# Patient Record
Sex: Male | Born: 1950 | Hispanic: No | Smoking: Never smoker
Health system: Southern US, Community
[De-identification: ages and names within clinical notes are randomized; demographics above are authoritative.]

## PROBLEM LIST (undated history)

## (undated) DIAGNOSIS — I10 Essential (primary) hypertension: Secondary | ICD-10-CM

## (undated) DIAGNOSIS — I491 Atrial premature depolarization: Secondary | ICD-10-CM

## (undated) DIAGNOSIS — R079 Chest pain, unspecified: Secondary | ICD-10-CM

## (undated) DIAGNOSIS — L408 Other psoriasis: Secondary | ICD-10-CM

## (undated) DIAGNOSIS — I493 Ventricular premature depolarization: Secondary | ICD-10-CM

## (undated) DIAGNOSIS — I77810 Thoracic aortic ectasia: Secondary | ICD-10-CM

## (undated) HISTORY — DX: Chest pain, unspecified: R07.9

## (undated) HISTORY — PX: APPENDECTOMY: SHX54

## (undated) HISTORY — DX: Essential (primary) hypertension: I10

## (undated) HISTORY — DX: Atrial premature depolarization: I49.1

## (undated) HISTORY — DX: Ventricular premature depolarization: I49.3

## (undated) HISTORY — DX: Thoracic aortic ectasia: I77.810

## (undated) HISTORY — DX: Other psoriasis: L40.8

---

## 2004-03-25 ENCOUNTER — Ambulatory Visit: Payer: Self-pay | Admitting: Cardiology

## 2004-04-01 ENCOUNTER — Ambulatory Visit: Payer: Self-pay

## 2004-05-27 ENCOUNTER — Ambulatory Visit: Payer: Self-pay | Admitting: Cardiovascular Disease

## 2005-06-13 ENCOUNTER — Ambulatory Visit: Payer: Self-pay | Admitting: Cardiovascular Disease

## 2006-06-29 ENCOUNTER — Ambulatory Visit: Payer: Self-pay | Admitting: Cardiovascular Disease

## 2007-06-21 ENCOUNTER — Ambulatory Visit: Payer: Self-pay | Admitting: Cardiovascular Disease

## 2008-02-18 ENCOUNTER — Ambulatory Visit: Payer: Self-pay | Admitting: Cardiovascular Disease

## 2008-02-18 LAB — CONVERTED CEMR LAB
Basophils Absolute: 0 10*3/uL (ref 0.0–0.1)
Basophils Relative: 0.2 % (ref 0.0–3.0)
Eosinophils Absolute: 0.4 10*3/uL (ref 0.0–0.7)
Hemoglobin: 15.3 g/dL (ref 13.0–17.0)
MCHC: 34.5 g/dL (ref 30.0–36.0)
MCV: 91.5 fL (ref 78.0–100.0)
Neutro Abs: 3.1 10*3/uL (ref 1.4–7.7)
RBC: 4.86 M/uL (ref 4.22–5.81)

## 2008-02-25 DIAGNOSIS — I10 Essential (primary) hypertension: Secondary | ICD-10-CM | POA: Insufficient documentation

## 2008-02-25 DIAGNOSIS — R079 Chest pain, unspecified: Secondary | ICD-10-CM

## 2008-02-25 DIAGNOSIS — L408 Other psoriasis: Secondary | ICD-10-CM

## 2009-02-05 ENCOUNTER — Ambulatory Visit: Payer: Self-pay | Admitting: Cardiovascular Disease

## 2010-01-21 ENCOUNTER — Ambulatory Visit: Payer: Self-pay | Admitting: Cardiovascular Disease

## 2010-01-21 DIAGNOSIS — I491 Atrial premature depolarization: Secondary | ICD-10-CM

## 2010-01-25 LAB — CONVERTED CEMR LAB
Basophils Relative: 0.5 % (ref 0.0–3.0)
Eosinophils Relative: 3.9 % (ref 0.0–5.0)
Hemoglobin: 15.4 g/dL (ref 13.0–17.0)
Lymphocytes Relative: 33.8 % (ref 12.0–46.0)
Monocytes Relative: 8.8 % (ref 3.0–12.0)
Neutro Abs: 4.3 10*3/uL (ref 1.4–7.7)
Neutrophils Relative %: 53 % (ref 43.0–77.0)
RBC: 4.98 M/uL (ref 4.22–5.81)
WBC: 8.1 10*3/uL (ref 4.5–10.5)

## 2010-02-03 ENCOUNTER — Encounter (INDEPENDENT_AMBULATORY_CARE_PROVIDER_SITE_OTHER): Payer: Self-pay | Admitting: *Deleted

## 2010-02-04 ENCOUNTER — Telehealth: Payer: Self-pay | Admitting: Cardiovascular Disease

## 2010-03-20 LAB — CONVERTED CEMR LAB
ANA Titer 1: 1:160 {titer} — ABNORMAL HIGH
Alkaline Phosphatase: 57 units/L (ref 39–117)
Basophils Absolute: 0.1 10*3/uL (ref 0.0–0.1)
Basophils Relative: 0.7 % (ref 0.0–3.0)
Bilirubin, Direct: 0.1 mg/dL (ref 0.0–0.3)
CO2: 26 meq/L (ref 19–32)
Calcium: 9.4 mg/dL (ref 8.4–10.5)
Creatinine, Ser: 0.9 mg/dL (ref 0.4–1.5)
Eosinophils Absolute: 0.4 10*3/uL (ref 0.0–0.7)
Lymphocytes Relative: 31.1 % (ref 12.0–46.0)
MCHC: 32.9 g/dL (ref 30.0–36.0)
Neutrophils Relative %: 53.8 % (ref 43.0–77.0)
Platelets: 262 10*3/uL (ref 150.0–400.0)
RBC: 4.68 M/uL (ref 4.22–5.81)
Total Bilirubin: 0.9 mg/dL (ref 0.3–1.2)
Total Protein: 7 g/dL (ref 6.0–8.3)
WBC: 7.5 10*3/uL (ref 4.5–10.5)

## 2010-03-22 NOTE — Assessment & Plan Note (Signed)
Summary: F1Y/DM      Allergies Added: NKDA  History of Present Illness: Chad Beasley is seen today for F/U of chest pain and HTN.  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  He has had a normal myuovue in 2006 and 2008.  He has HTN which is well Rx with Altace.  He has not had frequent primary care F/U.  He sees Chief Financial Officer in [place].  He is on [place] for psoriasis and needs blood work for this especially a CBC with PLT.  He still works at [employer] and is not having any fatique, dyspnea, fever, palpatations or edema  He is having some PAC's on exam today which are asymptomatic  Current Problems (verified): 1)  Long-term (CURRENT) Use of Other Medications  (ICD-V58.69) 2)  Psoriasis  (ICD-696.1) 3)  Chest Pain-unspecified  (ICD-786.50) 4)  Hypertension  (ICD-401.9)  Current Medications (verified): 1)  Ramipril 5 Mg Caps (Ramipril) .... Take One Capsule By Mouth Daily 2)  Humira 20 Mg/0.20ml Kit (Adalimumab) .... Two Times Monthly 3)  Vitamin C 500 Mg Tabs (Ascorbic Acid) .Marland Kitchen.. 1 Tab By Mouth Once Daily 4)  Fish Oil   Oil (Fish Oil) .Marland Kitchen.. 1 Tab By Mouth Once Daily  Allergies (verified): No Known Drug Allergies  Past History:  Past Medical History: Last updated: 02/25/2008 CHEST PAIN-UNSPECIFIED (ICD-786.50) HYPERTENSION (ICD-401.9) Psoriasis  Family History: Last updated: 02/25/2008 Negative for premature CAD  Social History: Last updated: 02/05/2009 Full Time [employer] in [employer] Married  Tobacco Use - No.  Regular Exercise - no Alcohol Use - yes(social)  Review of Systems       Denies fever, malais, weight loss, blurry vision, decreased visual acuity, cough, sputum, SOB, hemoptysis, pleuritic pain, palpitaitons, heartburn, abdominal pain, melena, lower extremity edema, claudication, or rash.   Vital Signs:  Patient profile:   60 year old male Pulse rate:   62 / minute Resp:     11 per minute BP supine:   120 / 80  Physical  Exam  General:  Affect appropriate Healthy:  appears stated age HEENT: normal Neck supple with no adenopathy JVP normal no bruits no thyromegaly Lungs clear with no wheezing and good diaphragmatic motion Heart:  S1/S2 no murmur,rub, gallop or click PMI normal Abdomen: benighn, BS positve, no tenderness, no AAA no bruit.  No HSM or HJR Distal pulses intact with no bruits No edema Neuro non-focal Skin warm and dry Psoriatic lesion on arms   Impression & Recommendations:  Problem # 1:  PSORIASIS (ICD-696.1) Continue Humera  CBC and PLT count today.    Problem # 2:  CHEST PAIN-UNSPECIFIED (ICD-786.50)  Non recurrent normal myovue in past Observe His updated medication list for this problem includes:    Ramipril 5 Mg Caps (Ramipril) .Marland Kitchen... Take one capsule by mouth daily  His updated medication list for this problem includes:    Ramipril 5 Mg Caps (Ramipril) .Marland Kitchen... Take one capsule by mouth daily  Problem # 3:  HYPERTENSION (ICD-401.9) Well controlled  [facility] His updated medication list for this problem includes:    Ramipril 5 Mg Caps (Ramipril) .Marland Kitchen... Take one capsule by mouth daily  His updated medication list for this problem includes:    Ramipril 5 Mg Caps (Ramipril) .Marland Kitchen... Take one capsule by mouth daily  Problem # 4:  PAC (ICD-427.61) Asymptomatic with no known structural heart disease Observe His updated medication list for this problem includes:    Ramipril 5  Mg Caps (Ramipril) .Marland Kitchen... Take one capsule by mouth daily  Other Orders: TLB-CBC Platelet - w/Differential (85025-CBCD)  Patient Instructions: 1)  Your physician recommends that you schedule a follow-up appointment in: YEAR WITH DR Eden Emms 2)  Your physician recommends that you return for lab work in: TODAY CBC WITH PLATELET V58.69 3)  Your physician recommends that you continue on your current medications as directed. Please refer to the Current Medication list given to you  today. Prescriptions: RAMIPRIL 5 MG CAPS (RAMIPRIL) Take one capsule by mouth daily  #90 x 3   Entered by:   Scherrie Bateman, LPN   Authorized by:   Colon Branch, MD, [facility]   Signed by:   Scherrie Bateman, LPN on 10/93/2355   Method used:   Faxed to ...       [facility] Environmental education officer)       [address] [address]       [place], [place]  [address]       Ph: [phone]       Fax: [phone]   RxID:   [id number]

## 2010-03-24 NOTE — Letter (Signed)
Summary: Generic Letter  Architectural technologist, Main Office  [address]   [place], [place] [address]   Phone: [phone]  Fax: [phone]        February 03, 2010 MRN: [id number]    Chad Beasley [address] [name], [place]  [address]    Dear [name],       I have not been able to reach you by phone to let you know your blood work results. We checked your blood count and everything was normal. Please call with questions or concerns.    Sincerely,  Deliah Goody, RN/Dr Charlton Haws

## 2010-03-24 NOTE — Progress Notes (Signed)
Summary: lab work   Phone Note Call from Patient Call back at [employer] [phone]   Caller: Patient Reason for Call: Talk to Nurse, Lab or Test Results Summary of Call: re blood work. pt states its fine to leave blood work results on vm. Initial call taken by: Roe Coombs,  February 04, 2010 8:36 AM  Follow-up for Phone Call        Pt. aware of lab results. Follow-up by: Ollen Gross, RN, BSN,  February 04, 2010 9:01 AM

## 2010-07-05 NOTE — Assessment & Plan Note (Signed)
[facility]                            CARDIOLOGY OFFICE NOTE   KENSON, GROH                           MRN:          [id number]  DATE:06/29/2006                            DOB:          08-14-1950    Chad Beasley is seen today in followup. He has had atypical chest pain in the  past with a normal Myoview. He has hypertension which is well controlled  on Altace, he has not had significant problems. He has not had any  recurrent chest pain or palpitations. He has been taking his Altace. He  needed a prescription. He prefers to stay on regular Altace and not  switch to generic Ramipril.   His weight has been stable, he is not drinking or smoking. He has been  watching the salt in his diet.   REVIEW OF SYSTEMS:  Remarkable for continued arthritis and psoriasis  which he takes Enbrel for and is actually under fairly good control  right now. Review of systems otherwise negative.   MEDICATIONS:  His only medications include Enbrel weekly and Altace 5 mg  a day.   PHYSICAL EXAMINATION:  GENERAL:  Remarkable for a healthy-appearing,  middle-aged male in no distress. Mood is appropriate.  VITAL SIGNS:  Blood pressure is 120/78, pulse 75 and regular, weight is  189 which is up 2 pounds from last April.  HEENT:  Normal.  NECK:  Carotids are normal without bruit. There is no lymphadenopathy.  No JVP elevation, no carotid bruits.  LUNGS:  Clear with no wheezing and normal diaphragmatic motion.  HEART:  There is an S1, S2 with normal heart sounds. PMI is normal.  ABDOMEN:  Benign. Bowel sounds are positive. There is no tenderness. No  hepatosplenomegaly. No hepatojugular reflux, no AAA and no renal bruits.  EXTREMITIES:  Distal pulses are intact with no edema. Femorals were +2  without bruit. PTs are palpable bilaterally. There is no  lymphadenopathy, no varicosities.  NEUROLOGIC:  Nonfocal. There is no muscular weakness. His EKG was  totally normal today.   IMPRESSION:  Stable previous atypical chest pain. Need for followup  treadmill test next April which will be 3 years from his last Myoview.  Continue blood pressure therapy. He will take a baby aspirin a day for  stroke prophylaxis. He was given a refill for his Altace today. I will  have to look up Enbrel to make sure there are no long-term side effects  referable to the heart. It was encouraging to see his EKG totally normal  today without evidence of LVH.     Noralyn Pick. Eden Emms, MD, [facility]  Electronically Signed   PCN/MedQ  DD: 06/29/2006  DT: 06/30/2006  Job #: [id number]

## 2010-07-05 NOTE — Procedures (Signed)
[facility]                              EXERCISE TREADMILL   LUCIUS, WISE                           MRN:          [id number]  DATE:06/21/2007                            DOB:          [birth year]    INDICATIONS:  Previous chest pain.   The patient exercised to the equivalent of 1 minute into stage IV of the  Bruce protocol. First 3 stages were advanced after 2 minutes.  Maximal  heart rate was 166, peak blood pressure is 173/89.  The patient had a  brief episode of bigeminy during the first stage.  There is no other  significant ectopy.  There was no chest pain.  There was no ischemia.   IMPRESSION:  Negative stress test with a good workload     Peter C. Eden Emms, MD, [facility]  Electronically Signed    PCN/MedQ  DD: 06/21/2007  DT: 06/21/2007  Job #: [id number]

## 2010-07-05 NOTE — Assessment & Plan Note (Signed)
[facility]                            CARDIOLOGY OFFICE NOTE   Chad Beasley, Chad Beasley                           MRN:          [id number]  DATE:02/18/2008                            DOB:          10/26/1950    Chad Beasley returns today for followup.  I have seen him for chest pain in  the past.  He had a treadmill test in May which was normal.   He has significant hypertension, we treated with Altace.  He has been  trying to watch salt in his diet.  The Altace has done a good job with  his blood pressure.   He has not had any significant chest pain, palpitations, PND, or  orthopnea.   REVIEW OF SYSTEMS:  Remarkable for continued psoriasis.  He takes Enbrel  for this.  He has not had a CBC and platelet count in over a year.   His psoriasis has flared somewhat lately.   Otherwise negative.   MEDICATIONS:  1. Enbrel 50 mcg injection weekly.  2. Altace 5 mg a day.   He uses [facility].   PHYSICAL EXAMINATION:  GENERAL:  Remarkable for a healthy-appearing  white male in no distress.  VITAL SIGNS:  Weight is 186, blood pressure 124/82, pulse 70 and  regular, respiratory rate 14, afebrile.  HEENT:  Unremarkable.  NECK:  Carotids are normal without bruit.  No lymphadenopathy,  thyromegaly, or JVP elevation.  LUNGS:  Clear.  Good diaphragmatic motion.  No wheezing.  CARDIAC:  S1, S2 with normal heart sounds.  PMI normal.  Abdomen is  benign.  Bowel sounds positive.  No AAA.  No tenderness.  No bruit.  No  hepatosplenomegaly or hepatojugular reflux.  EXTREMITIES:  Distal pulses are intact.  No edema.  NEUROLOGIC:  Nonfocal.  SKIN:  Warm and dry with some psoriatic plaques over the elbows and  knees.  MUSCULOSKELETAL:  No muscular weakness.   IMPRESSION:  1. Previous chest pain, non recurrent, normal exercise treadmill test      in May.  Continue to monitor, baby aspirin a day.  2. Hypertension, currently well controlled.  Continue low-sodium diet     and Altace.  3. Psoriasis.  CBC and platelet count to be done today.  I warned him      about the risks of      chronic immunosuppression on Enbrel.  Followup with Dr. Mayford Knife      regarding further therapy for his psoriasis.     Noralyn Pick. Eden Emms, MD, [facility]  Electronically Signed    PCN/MedQ  DD: 02/18/2008  DT: 02/18/2008  Job #: [id number]

## 2011-05-05 ENCOUNTER — Ambulatory Visit (INDEPENDENT_AMBULATORY_CARE_PROVIDER_SITE_OTHER): Payer: Managed Care, Other (non HMO) | Admitting: Family Medicine

## 2011-05-05 VITALS — BP 138/80 | HR 77 | Temp 98.2°F | Resp 16 | Ht 72.0 in | Wt 191.6 lb

## 2011-05-05 DIAGNOSIS — J019 Acute sinusitis, unspecified: Secondary | ICD-10-CM

## 2011-05-05 MED ORDER — AMOXICILLIN 875 MG PO TABS: 875.0000 mg | ORAL_TABLET | Freq: Two times a day (BID) | ORAL | Status: AC

## 2011-05-05 NOTE — Progress Notes (Signed)
61 year old gentleman with sinus congestion for 3 weeks. He complains of discolored mucus and epistaxis along with this voice had no fever. He's tried saline nasal lavage which has helped a little at times. 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  Objective: Mucopurulent nasal discharge bilateral nasal passages  TMs-negative, oropharynx: Negative  Neck: Supple no adenopathy  Chest clear to auscultation  Heart: Regular no murmur  Skin warm and dry  Assessment: Sinusitis greater than 10 days  Plan amoxicillin

## 2012-01-29 ENCOUNTER — Encounter: Payer: Self-pay | Admitting: *Deleted

## 2012-01-29 ENCOUNTER — Ambulatory Visit (INDEPENDENT_AMBULATORY_CARE_PROVIDER_SITE_OTHER): Payer: Managed Care, Other (non HMO) | Admitting: Cardiovascular Disease

## 2012-01-29 ENCOUNTER — Encounter: Payer: Self-pay | Admitting: Cardiovascular Disease

## 2012-01-29 VITALS — BP 144/74 | HR 88 | Ht 71.0 in | Wt 190.0 lb

## 2012-01-29 DIAGNOSIS — Z79899 Other long term (current) drug therapy: Secondary | ICD-10-CM

## 2012-01-29 DIAGNOSIS — I498 Other specified cardiac arrhythmias: Secondary | ICD-10-CM

## 2012-01-29 DIAGNOSIS — R008 Other abnormalities of heart beat: Secondary | ICD-10-CM

## 2012-01-29 DIAGNOSIS — I10 Essential (primary) hypertension: Secondary | ICD-10-CM

## 2012-01-29 DIAGNOSIS — L408 Other psoriasis: Secondary | ICD-10-CM

## 2012-01-29 LAB — CBC WITH DIFFERENTIAL/PLATELET
Basophils Relative: 0.4 % (ref 0.0–3.0)
Eosinophils Relative: 3.1 % (ref 0.0–5.0)
Lymphocytes Relative: 25.2 % (ref 12.0–46.0)
MCV: 91.5 fl (ref 78.0–100.0)
Monocytes Absolute: 0.9 10*3/uL (ref 0.1–1.0)
Monocytes Relative: 8.4 % (ref 3.0–12.0)
Neutrophils Relative %: 62.9 % (ref 43.0–77.0)
RBC: 4.82 Mil/uL (ref 4.22–5.81)
WBC: 10.8 10*3/uL — ABNORMAL HIGH (ref 4.5–10.5)

## 2012-01-29 MED ORDER — LOSARTAN POTASSIUM 100 MG PO TABS: 100.0000 mg | ORAL_TABLET | Freq: Every day | ORAL | Status: DC

## 2012-01-29 NOTE — Assessment & Plan Note (Signed)
D/C ramapril  Change to Cozaar Low sodium diet  See what BP does with exercise

## 2012-01-29 NOTE — Progress Notes (Signed)
Patient ID: Chad Beasley, male   DOB: 11/17/1950, 61 y.o.   MRN: [id number] Chad Beasley is seen today for F/U of chest pain and HTN. Has not been seen in 3 years.   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 He has had a normal myuovue in 2006 and 2008. He has HTN which does not appear to be adequatley Rx  He has not had frequent primary care F/U. He sees Chief Financial Officer in [place]. He is on [place] for psoriasis and needs blood work for this especially a CBC with PLT. He still works at [employer] and is not having any fatique, dyspnea, fever, palpatations or edema  Was in bigeminny today which is new for him Unaware of arrhythmia   ROS: Denies fever, malais, weight loss, blurry vision, decreased visual acuity, cough, sputum, SOB, hemoptysis, pleuritic pain, palpitaitons, heartburn, abdominal pain, melena, lower extremity edema, claudication, or rash.  All other systems reviewed and negative  General: Affect appropriate Healthy:  appears stated age HEENT: normal Neck supple with no adenopathy JVP normal no bruits no thyromegaly Lungs clear with no wheezing and good diaphragmatic motion Heart:  S1/S2 no murmur, no rub, gallop or click PMI normal Abdomen: benighn, BS positve, no tenderness, no AAA no bruit.  No HSM or HJR Distal pulses intact with no bruits No edema Neuro non-focal Skin warm and dry No muscular weakness   Current Outpatient Prescriptions  Medication Sig Dispense Refill  . Adalimumab (HUMIRA) 20 MG/0.4ML KIT Inject 20 mg into the skin See admin instructions. 2 times monthly      . Omeprazole (PRILOSEC PO) Take 1 tablet by mouth daily.      . ramipril (ALTACE) 5 MG capsule Take 5 mg by mouth daily.      . Red Yeast Rice Extract (RED YEAST RICE PO) Take 2 tablets by mouth daily.      . ST JOHNS WORT PO Take 2 tablets by mouth daily.      . vitamin C (ASCORBIC ACID) 500 MG tablet Take 500 mg by mouth daily.        Allergies  Review of patient's allergies indicates no  known allergies.  Electrocardiogram:  SR rate 88 bigeminy with unifocal PVC;s  Assessment and Plan

## 2012-01-29 NOTE — Assessment & Plan Note (Signed)
Needs F/U stress echo to make sure no NSVT with exercise, assess BP response andn EF given frequent PVC;s

## 2012-01-29 NOTE — Assessment & Plan Note (Signed)
Continue Humera  F/U CBC and PLT

## 2012-01-29 NOTE — Patient Instructions (Addendum)
Your physician wants you to follow-up in:   6 MONTHS  WITH DR Haywood Filler will receive a reminder letter in the mail two months in advance. If you don't receive a letter, please call our office to schedule the follow-up appointment. Your physician has recommended you make the following change in your medication: STOP RAMIPRIL START  LOSARTAN 100 MG  EVERY DAY Your physician has requested that you have a stress echocardiogram. For further information please visit [email or web address] Please follow instruction sheet as given. DX PVC Your physician recommends that you return for lab work in: TODAY CBC

## 2012-02-12 ENCOUNTER — Ambulatory Visit (HOSPITAL_BASED_OUTPATIENT_CLINIC_OR_DEPARTMENT_OTHER): Payer: Managed Care, Other (non HMO)

## 2012-02-12 ENCOUNTER — Ambulatory Visit (HOSPITAL_COMMUNITY): Payer: Managed Care, Other (non HMO) | Attending: Cardiology | Admitting: Radiology

## 2012-02-12 ENCOUNTER — Encounter: Payer: Self-pay | Admitting: Cardiovascular Disease

## 2012-02-12 DIAGNOSIS — I491 Atrial premature depolarization: Secondary | ICD-10-CM | POA: Insufficient documentation

## 2012-02-12 DIAGNOSIS — R072 Precordial pain: Secondary | ICD-10-CM | POA: Insufficient documentation

## 2012-02-12 DIAGNOSIS — R079 Chest pain, unspecified: Secondary | ICD-10-CM | POA: Insufficient documentation

## 2012-02-12 DIAGNOSIS — I10 Essential (primary) hypertension: Secondary | ICD-10-CM | POA: Insufficient documentation

## 2012-02-12 DIAGNOSIS — R008 Other abnormalities of heart beat: Secondary | ICD-10-CM

## 2012-02-12 DIAGNOSIS — R0989 Other specified symptoms and signs involving the circulatory and respiratory systems: Secondary | ICD-10-CM

## 2012-02-12 NOTE — Progress Notes (Signed)
Echocardiogram performed.  

## 2012-08-13 ENCOUNTER — Ambulatory Visit (INDEPENDENT_AMBULATORY_CARE_PROVIDER_SITE_OTHER): Payer: Managed Care, Other (non HMO) | Admitting: Cardiovascular Disease

## 2012-08-13 ENCOUNTER — Encounter: Payer: Self-pay | Admitting: Cardiovascular Disease

## 2012-08-13 VITALS — BP 154/80 | HR 82 | Wt 187.0 lb

## 2012-08-13 DIAGNOSIS — I10 Essential (primary) hypertension: Secondary | ICD-10-CM

## 2012-08-13 MED ORDER — LOSARTAN POTASSIUM 100 MG PO TABS: 100.0000 mg | ORAL_TABLET | Freq: Every day | ORAL | Status: DC

## 2012-08-13 NOTE — Patient Instructions (Addendum)
Your physician wants you to follow-up in: YEAR WITH DR NISHAN  You will receive a reminder letter in the mail two months in advance. If you don't receive a letter, please call our office to schedule the follow-up appointment.  Your physician recommends that you continue on your current medications as directed. Please refer to the Current Medication list given to you today. 

## 2012-08-13 NOTE — Assessment & Plan Note (Signed)
Normal EF No CAD no worsening with exercise  Observe Only notices at night when he lays down

## 2012-08-13 NOTE — Assessment & Plan Note (Signed)
Well controlled.  Continue current medications and low sodium Dash type diet.    

## 2012-08-13 NOTE — Assessment & Plan Note (Signed)
Small patch on right knee Continue humira  F/U Dr Mayford Knife

## 2012-08-13 NOTE — Progress Notes (Signed)
Patient ID: Chad Beasley, male   DOB: [birth year], 62 y.o.   MRN: [id number] Chad Beasley is seen today for F/U of chest pain and HTN. 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 He has had a normal myuovue in 2006 and 2008. He has HTN which is well Rx with Altace. He has not had frequent primary care F/U.   He is on [place] for psoriasis and CBC ok 12/13  . He still works at [employer] and is not having any fatique, dyspnea, fever, palpatations or edema He is having some PAC's on exam today which are asymptomatic  Dr Mayford Knife folloows CBC with humira Dermatology  [facility] family is primary  Stress echo done 6/13   Baseline:  - LV size was normal. - The estimated LV ejection fraction was 55%. - Normal wall motion; no LV regional wall motion abnormalities. Peak stress:  - LV size was normal. - The estimated LV ejection fraction was 65%. - Normal wall motion; no LV regional wall motion abnormalities.  ROS: Denies fever, malais, weight loss, blurry vision, decreased visual acuity, cough, sputum, SOB, hemoptysis, pleuritic pain, palpitaitons, heartburn, abdominal pain, melena, lower extremity edema, claudication, or rash.  All other systems reviewed and negative  General: Affect appropriate Healthy:  appears stated age HEENT: normal Neck supple with no adenopathy JVP normal no bruits no thyromegaly Lungs clear with no wheezing and good diaphragmatic motion Heart:  S1/S2 no murmur, no rub, gallop or click PMI normal Abdomen: benighn, BS positve, no tenderness, no AAA no bruit.  No HSM or HJR Distal pulses intact with no bruits No edema Neuro non-focal Skin warm and dry No muscular weakness   Current Outpatient Prescriptions  Medication Sig Dispense Refill  . Adalimumab (HUMIRA) 20 MG/0.4ML KIT Inject 20 mg into the skin See admin instructions. 2 times monthly      . losartan (COZAAR) 100 MG tablet Take 1 tablet (100 mg total) by mouth daily.  90 tablet  3  . Omeprazole  (PRILOSEC PO) Take 1 tablet by mouth daily.      . Red Yeast Rice Extract (RED YEAST RICE PO) Take 2 tablets by mouth daily.      . ST JOHNS WORT PO Take 2 tablets by mouth daily.      . vitamin C (ASCORBIC ACID) 500 MG tablet Take 500 mg by mouth daily.       No current facility-administered medications for this visit.    Allergies  Review of patient's allergies indicates no known allergies.  Electrocardiogram:  SR PVC;s no acute ischemic changes 12/13   Assessment and Plan

## 2012-08-13 NOTE — Assessment & Plan Note (Signed)
Non recurrent normal stress echo 9/13

## 2012-10-07 ENCOUNTER — Ambulatory Visit (INDEPENDENT_AMBULATORY_CARE_PROVIDER_SITE_OTHER): Payer: Managed Care, Other (non HMO) | Admitting: Emergency Medicine

## 2012-10-07 VITALS — BP 122/78 | HR 70 | Temp 98.0°F | Resp 18 | Ht 72.0 in | Wt 186.0 lb

## 2012-10-07 DIAGNOSIS — L408 Other psoriasis: Secondary | ICD-10-CM

## 2012-10-07 DIAGNOSIS — L409 Psoriasis, unspecified: Secondary | ICD-10-CM

## 2012-10-07 DIAGNOSIS — B3742 Candidal balanitis: Secondary | ICD-10-CM

## 2012-10-07 DIAGNOSIS — B3749 Other urogenital candidiasis: Secondary | ICD-10-CM

## 2012-10-07 MED ORDER — TRIAMCINOLONE ACETONIDE 0.1 % EX CREA: TOPICAL_CREAM | Freq: Two times a day (BID) | CUTANEOUS | Status: DC

## 2012-10-07 MED ORDER — FLUCONAZOLE 100 MG PO TABS: 100.0000 mg | ORAL_TABLET | Freq: Every day | ORAL | Status: DC

## 2012-10-07 NOTE — Progress Notes (Signed)
[facility] [address] [place] [address] [phone]  Date:  10/07/2012   Name:  Chad Beasley   DOB:  [birth year]   MRN:  [id number]  PCP:  Desmond Dike, MD    Chief Complaint: Rash   History of Present Illness:  Chad Beasley is a 62 y.o. very pleasant male patient who presents with the following:  History of psoriasis and on humira.  Has an erythematous flat eruption on his glans and foreskin.  Circumcised.  Rash is pruritic and burns.  No discharge or dysuria.  Not sexually active as his partner is recovering from chemo for non hodgkins lymphoma.  No improvement with over the counter medications or other home remedies. Denies other complaint or health concern today.   Patient Active Problem List   Diagnosis Date Noted  . 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 [facility] 01/21/2010  . HYPERTENSION 02/25/2008  . PSORIASIS 02/25/2008  . CHEST PAIN-UNSPECIFIED 02/25/2008    Past Medical History  Diagnosis Date  . HYPERTENSION   . PAC   . PSORIASIS   . CHEST PAIN-UNSPECIFIED     History reviewed. No pertinent past surgical history.  History  Substance Use Topics  . Smoking status: Never Smoker   . Smokeless tobacco: Not on file  . Alcohol Use: Not on file    Family History  Problem Relation Age of Onset  . Anemia Father     No Known Allergies  Medication list has been reviewed and updated.  Current Outpatient Prescriptions on File Prior to Visit  Medication Sig Dispense Refill  . Adalimumab (HUMIRA) 20 MG/0.4ML KIT Inject 40 mg into the skin See admin instructions. 2 times monthly      . losartan (COZAAR) 100 MG tablet Take 1 tablet (100 mg total) by mouth daily.  90 tablet  3  . Omeprazole (PRILOSEC PO) Take 1 tablet by mouth daily.      . Red Yeast Rice Extract (RED YEAST RICE PO) Take 2 tablets by mouth daily.      . ST JOHNS WORT PO Take 2 tablets by mouth daily.      . vitamin C (ASCORBIC ACID) 500 MG tablet Take 1,000 mg by mouth daily.         No current facility-administered medications on file prior to visit.    Review of Systems:  As per HPI, otherwise negative.    Physical Examination: Filed Vitals:   10/07/12 0811  BP: 122/78  Pulse: 70  Temp: 98 F (36.7 C)  Resp: 18   Filed Vitals:   10/07/12 0811  Height: 6' (1.829 m)  Weight: 186 lb (84.369 kg)   Body mass index is 25.22 kg/(m^2). Ideal Body Weight: Weight in (lb) to have BMI = 25: 183.9   GEN: WDWN, NAD, Non-toxic, Alert & Oriented x 3 HEENT: Atraumatic, Normocephalic.  Ears and Nose: No external deformity. EXTR: No clubbing/cyanosis/edema NEURO: Normal gait.  PSYCH: Normally interactive. Conversant. Not depressed or anxious appearing.  Calm demeanor.  GENITALIA:  Circumcised male with erythematous flat patches, not scaly.  No excoriation or vesicles.    Assessment and Plan: Psoriasis vs candida Diflucan TAC  Signed,  Phillips Odor, MD

## 2012-10-07 NOTE — Patient Instructions (Addendum)
Candida Infection, Adult A candida infection (also called yeast, fungus and Monilia infection) is an overgrowth of yeast that can occur anywhere on the body. A yeast infection commonly occurs in warm, moist body areas. Usually, the infection remains localized but can spread to become a systemic infection. A yeast infection may be a sign of a more severe disease such as diabetes, leukemia, or AIDS. A yeast infection can occur in both men and women. In women, Candida vaginitis is a vaginal infection. It is one of the most common causes of vaginitis. Men usually do not have symptoms or know they have an infection until other problems develop. Men may find out they have a yeast infection because their sex partner has a yeast infection. Uncircumcised men are more likely to get a yeast infection than circumcised men. This is because the uncircumcised glans is not exposed to air and does not remain as dry as that of a circumcised glans. Older adults may develop yeast infections around dentures. CAUSES  Women  Antibiotics.  Steroid medication taken for a long time.  Being overweight (obese).  Diabetes.  Poor immune condition.  Certain serious medical conditions.  Immune suppressive medications for organ transplant patients.  Chemotherapy.  Pregnancy.  Menstration.  Stress and fatigue.  Intravenous drug use.  Oral contraceptives.  Wearing tight-fitting clothes in the crotch area.  Catching it from a sex partner who has a yeast infection.  Spermicide.  Intravenous, urinary, or other catheters. Men  Catching it from a sex partner who has a yeast infection.  Having oral or anal sex with a person who has the infection.  Spermicide.  Diabetes.  Antibiotics.  Poor immune system.  Medications that suppress the immune system.  Intravenous drug use.  Intravenous, urinary, or other catheters. SYMPTOMS  Women  Thick, white vaginal discharge.  Vaginal itching.  Redness and  swelling in and around the vagina.  Irritation of the lips of the vagina and perineum.  Blisters on the vaginal lips and perineum.  Painful sexual intercourse.  Low blood sugar (hypoglycemia).  Painful urination.  Bladder infections.  Intestinal problems such as constipation, indigestion, bad breath, bloating, increase in gas, diarrhea, or loose stools. Men  Men may develop intestinal problems such as constipation, indigestion, bad breath, bloating, increase in gas, diarrhea, or loose stools.  Dry, cracked skin on the penis with itching or discomfort.  Jock itch.  Dry, flaky skin.  Athlete's foot.  Hypoglycemia. DIAGNOSIS  Women  A history and an exam are performed.  The discharge may be examined under a microscope.  A culture may be taken of the discharge. Men  A history and an exam are performed.  Any discharge from the penis or areas of cracked skin will be looked at under the microscope and cultured.  Stool samples may be cultured. TREATMENT  Women  Vaginal antifungal suppositories and creams.  Medicated creams to decrease irritation and itching on the outside of the vagina.  Warm compresses to the perineal area to decrease swelling and discomfort.  Oral antifungal medications.  Medicated vaginal suppositories or cream for repeated or recurrent infections.  Wash and dry the irritation areas before applying the cream.  Eating yogurt with lactobacillus may help with prevention and treatment.  Sometimes painting the vagina with gentian violet solution may help if creams and suppositories do not work. Men  Antifungal creams and oral antifungal medications.  Sometimes treatment must continue for 30 days after the symptoms go away to prevent recurrence. HOME CARE   INSTRUCTIONS  Women  Use cotton underwear and avoid tight-fitting clothing.  Avoid colored, scented toilet paper and deodorant tampons or pads.  Do not douche.  Keep your diabetes  under control.  Finish all the prescribed medications.  Keep your skin clean and dry.  Consume milk or yogurt with lactobacillus active culture regularly. If you get frequent yeast infections and think that is what the infection is, there are over-the-counter medications that you can get. If the infection does not show healing in 3 days, talk to your caregiver.  Tell your sex partner you have a yeast infection. Your partner may need treatment also, especially if your infection does not clear up or recurs. Men  Keep your skin clean and dry.  Keep your diabetes under control.  Finish all prescribed medications.  Tell your sex partner that you have a yeast infection so they can be treated if necessary. SEEK MEDICAL CARE IF:   Your symptoms do not clear up or worsen in one week after treatment.  You have an oral temperature above 102 F (38.9 C).  You have trouble swallowing or eating for a prolonged time.  You develop blisters on and around your vagina.  You develop vaginal bleeding and it is not your menstrual period.  You develop abdominal pain.  You develop intestinal problems as mentioned above.  You get weak or lightheaded.  You have painful or increased urination.  You have pain during sexual intercourse. MAKE SURE YOU:   Understand these instructions.  Will watch your condition.  Will get help right away if you are not doing well or get worse. Document Released: 03/16/2004 Document Revised: 05/01/2011 Document Reviewed: 06/28/2009 Exit[employer] Patient Information 2014 [employer].  

## 2013-06-26 ENCOUNTER — Encounter: Payer: Self-pay | Admitting: Cardiovascular Disease

## 2013-08-08 ENCOUNTER — Ambulatory Visit: Payer: Managed Care, Other (non HMO) | Admitting: Cardiovascular Disease

## 2013-08-21 ENCOUNTER — Encounter: Payer: Self-pay | Admitting: *Deleted

## 2013-08-26 ENCOUNTER — Ambulatory Visit: Payer: Managed Care, Other (non HMO) | Admitting: Cardiovascular Disease

## 2013-08-28 ENCOUNTER — Encounter: Payer: Self-pay | Admitting: Cardiovascular Disease

## 2013-08-28 ENCOUNTER — Ambulatory Visit (INDEPENDENT_AMBULATORY_CARE_PROVIDER_SITE_OTHER): Payer: Managed Care, Other (non HMO) | Admitting: Cardiovascular Disease

## 2013-08-28 VITALS — BP 140/64 | HR 88 | Ht 70.5 in | Wt 186.0 lb

## 2013-08-28 DIAGNOSIS — I10 Essential (primary) hypertension: Secondary | ICD-10-CM

## 2013-08-28 DIAGNOSIS — I493 Ventricular premature depolarization: Secondary | ICD-10-CM

## 2013-08-28 DIAGNOSIS — I4949 Other premature depolarization: Secondary | ICD-10-CM

## 2013-08-28 MED ORDER — LOSARTAN POTASSIUM 100 MG PO TABS: 100.0000 mg | ORAL_TABLET | Freq: Every day | ORAL | Status: DC

## 2013-08-28 NOTE — Patient Instructions (Signed)
Your physician recommends that you schedule a follow-up appointment in: [place]   Your physician recommends that you continue on your current medications as directed. Please refer to the Current Medication list given to you today. Your physician has requested that you have an echocardiogram. Echocardiography is a painless test that uses sound waves to create images of your heart. It provides your doctor with information about the size and shape of your heart and how well your heart's chambers and valves are working. This procedure takes approximately one hour. There are no restrictions for this procedure.  Your physician has recommended that you wear a holter monitor. Holter monitors are medical devices that record the heart's electrical activity. Doctors most often use these monitors to diagnose arrhythmias. Arrhythmias are problems with the speed or rhythm of the heartbeat. The monitor is a small, portable device. You can wear one while you do your normal daily activities. This is usually used to diagnose what is causing palpitations/syncope (passing out).  24 HOUR

## 2013-08-28 NOTE — Assessment & Plan Note (Signed)
Echo given ETOH intake and 24 hr monitor  Likely would benefit from beta blocker especially with borderline BP

## 2013-08-28 NOTE — Progress Notes (Signed)
Patient ID: Chad Beasley, male   DOB: 1950/12/02, 63 y.o.   MRN: [id number] Sur[place] is seen today for F/U of chest pain and HTN. Has not been seen in 3 years. 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 He has had a normal myuovue in 2006 and 2008. He has HTN which does not appear to be adequatley Rx He has not had frequent primary care F/U. He sees Education officer, environmental in [place]. He is on [place] for psoriasis and needs blood work for this especially a CBC with PLT. He still works at [employer] and is not having any fatique, dyspnea, fever, palpatations or edema  Still having beers daily  Needs refill on BP med  Compliant  PAC;s and PVC;s on exam    ROS: Denies fever, malais, weight loss, blurry vision, decreased visual acuity, cough, sputum, SOB, hemoptysis, pleuritic pain, palpitaitons, heartburn, abdominal pain, melena, lower extremity edema, claudication, or rash.  All other systems reviewed and negative  General: Affect appropriate Healthy:  appears stated age 52: normal Neck supple with no adenopathy JVP normal no bruits no thyromegaly Lungs clear with no wheezing and good diaphragmatic motion Heart:  S1/S2 no murmur, no rub, gallop or click PMI normal Abdomen: benighn, BS positve, no tenderness, no AAA no bruit.  No HSM or HJR Distal pulses intact with no bruits No edema Neuro non-focal Skin warm and dry No muscular weakness   Current Outpatient Prescriptions  Medication Sig Dispense Refill  . Adalimumab (HUMIRA) 20 MG/0.4ML KIT Inject 40 mg into the skin See admin instructions. 2 times monthly      . clobetasol cream (TEMOVATE) 3.75 % Apply 1 application topically 2 (two) times daily.      Marland Kitchen losartan (COZAAR) 100 MG tablet Take 1 tablet (100 mg total) by mouth daily.  90 tablet  3  . Omeprazole (PRILOSEC PO) Take 1 tablet by mouth daily.      . Red Yeast Rice Extract (RED YEAST RICE PO) Take 2 tablets by mouth daily.      . vitamin C (ASCORBIC ACID) 500 MG tablet Take  1,000 mg by mouth daily.       . ST JOHNS WORT PO Take 2 tablets by mouth daily.       No current facility-administered medications for this visit.    Allergies  Review of patient's allergies indicates no known allergies.  Electrocardiogram:   SR rate 88  PAC PVC    Assessment and Plan

## 2013-08-28 NOTE — Assessment & Plan Note (Signed)
Borderline control continue current meds Home readings and recheck next available

## 2013-09-24 ENCOUNTER — Ambulatory Visit (HOSPITAL_COMMUNITY): Payer: Managed Care, Other (non HMO) | Attending: Cardiovascular Disease | Admitting: Radiology

## 2013-09-24 ENCOUNTER — Encounter: Payer: Self-pay | Admitting: Radiology

## 2013-09-24 ENCOUNTER — Encounter (INDEPENDENT_AMBULATORY_CARE_PROVIDER_SITE_OTHER): Payer: Managed Care, Other (non HMO)

## 2013-09-24 DIAGNOSIS — I493 Ventricular premature depolarization: Secondary | ICD-10-CM

## 2013-09-24 DIAGNOSIS — I491 Atrial premature depolarization: Secondary | ICD-10-CM

## 2013-09-24 DIAGNOSIS — I4949 Other premature depolarization: Secondary | ICD-10-CM

## 2013-09-24 NOTE — Progress Notes (Signed)
Patient ID: Chad Beasley, male   DOB: 1951/02/16, 63 y.o.   MRN: [id number] E cardio 24 hr holter monitor applied

## 2013-09-24 NOTE — Progress Notes (Signed)
Echocardiogram performed.  

## 2013-10-09 ENCOUNTER — Telehealth: Payer: Self-pay | Admitting: Cardiovascular Disease

## 2013-10-09 NOTE — Telephone Encounter (Signed)
New Message  Pt called to receive ECHO and monitor results. Please call

## 2013-10-10 NOTE — Telephone Encounter (Signed)
LMTCB ./CY 

## 2013-10-13 NOTE — Telephone Encounter (Signed)
PT  AWARE OF MONITOR  AND  ECHO RESULTS .Adonis Housekeeper

## 2014-01-22 ENCOUNTER — Ambulatory Visit (INDEPENDENT_AMBULATORY_CARE_PROVIDER_SITE_OTHER): Payer: Managed Care, Other (non HMO) | Admitting: Emergency Medicine

## 2014-01-22 VITALS — BP 132/88 | HR 84 | Temp 98.3°F | Resp 16 | Ht 71.0 in | Wt 191.4 lb

## 2014-01-22 DIAGNOSIS — J0140 Acute pansinusitis, unspecified: Secondary | ICD-10-CM

## 2014-01-22 DIAGNOSIS — J209 Acute bronchitis, unspecified: Secondary | ICD-10-CM

## 2014-01-22 MED ORDER — AMOXICILLIN-POT CLAVULANATE 875-125 MG PO TABS: 1.0000 | ORAL_TABLET | Freq: Two times a day (BID) | ORAL | Status: DC

## 2014-01-22 MED ORDER — HYDROCOD POLST-CHLORPHEN POLST 10-8 MG/5ML PO LQCR: 5.0000 mL | Freq: Two times a day (BID) | ORAL | Status: DC | PRN

## 2014-01-22 MED ORDER — PSEUDOEPHEDRINE-GUAIFENESIN ER 60-600 MG PO TB12: 1.0000 | ORAL_TABLET | Freq: Two times a day (BID) | ORAL | Status: AC

## 2014-01-22 NOTE — Progress Notes (Signed)
[facility] [address] [place] [address] [phone]  Date:  01/22/2014   Name:  Chad Beasley   DOB:  [birth year]   MRN:  [id number]  PCP:  Carlus Pavlov, MD    Chief Complaint: URI   History of Present Illness:  Chad Beasley is a 63 y.o. very pleasant male patient who presents with the following:  Patient has experienced a nasal drainage that is largely mucoid for weeks.  Over the past day it has become purulent It is associated with a post nasal drainage and a sore throat.  He has a cough productive of mucopurulent sputum No wheezing or shortness of breath.   No nausea or vomiting.  No stool change No fever or chills. No improvement with over the counter medications or other home remedies.  Denies other complaint or health concern today.   Patient Active Problem List   Diagnosis Date Noted  . 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 [facility] 01/21/2010  . HYPERTENSION 02/25/2008  . PSORIASIS 02/25/2008  . CHEST PAIN-UNSPECIFIED 02/25/2008    Past Medical History  Diagnosis Date  . HYPERTENSION   . PAC   . PSORIASIS   . CHEST PAIN-UNSPECIFIED     No past surgical history on file.  History  Substance Use Topics  . Smoking status: Never Smoker   . Smokeless tobacco: Not on file  . Alcohol Use: Not on file    Family History  Problem Relation Age of Onset  . Anemia Father     No Known Allergies  Medication list has been reviewed and updated.  Current Outpatient Prescriptions on File Prior to Visit  Medication Sig Dispense Refill  . Adalimumab (HUMIRA) 20 MG/0.4ML KIT Inject 40 mg into the skin See admin instructions. 2 times monthly    . clobetasol cream (TEMOVATE) 0.10 % Apply 1 application topically 2 (two) times daily.    Marland Kitchen losartan (COZAAR) 100 MG tablet Take 1 tablet (100 mg total) by mouth daily. 90 tablet 3  . Omeprazole (PRILOSEC PO) Take 1 tablet by mouth daily.    . Red Yeast Rice Extract (RED YEAST RICE PO) Take 2 tablets by mouth  daily.    . ST JOHNS WORT PO Take 2 tablets by mouth daily.    . vitamin C (ASCORBIC ACID) 500 MG tablet Take 1,000 mg by mouth daily.      No current facility-administered medications on file prior to visit.    Review of Systems:  As per HPI, otherwise negative.    Physical Examination: Filed Vitals:   01/22/14 0957  BP: 132/88  Pulse: 84  Temp: 98.3 F (36.8 C)  Resp: 16   Filed Vitals:   01/22/14 0957  Height: '5\' 11"'  (1.803 m)  Weight: 191 lb 6.4 oz (86.818 kg)   Body mass index is 26.71 kg/(m^2). Ideal Body Weight: Weight in (lb) to have BMI = 25: 178.9  GEN: WDWN, NAD, Non-toxic, A & O x 3 HEENT: Atraumatic, Normocephalic. Neck supple. No masses, No LAD. Ears and Nose: No external deformity. CV: RRR, No M/G/R. No JVD. No thrill. No extra heart sounds. PULM: CTA B, no wheezes, crackles, rhonchi. No retractions. No resp. distress. No accessory muscle use. ABD: S, NT, ND, +BS. No rebound. No HSM. EXTR: No c/c/e NEURO Normal gait.  PSYCH: Normally interactive. Conversant. Not depressed or anxious appearing.  Calm demeanor.    Assessment and Plan: Sinusitis Bronchitis augmentin mucinex tussionex  Signed,  Ellison Carwin, MD

## 2014-01-22 NOTE — Patient Instructions (Signed)

## 2014-02-20 HISTORY — PX: OTHER SURGICAL HISTORY: SHX169

## 2014-09-13 NOTE — Progress Notes (Signed)
Patient ID: Chad Beasley, male   DOB: 05-05-1950, 64 y.o.   MRN: [id number] Chad Beasley is seen today for F/U of chest pain and HTN.  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 He has had a normal myuovue in 2006 and 2008. He has HTN which does not appear to be adequatley Rx He has not had frequent primary care F/U. He sees Education officer, environmental in [place]. He is on [place] for psoriasis and needs blood work for this especially a CBC with PLT. 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  All other systems reviewed and negative  General: Affect appropriate Healthy:  appears stated age 64: normal Neck supple with no adenopathy JVP normal no bruits no thyromegaly Lungs clear with no wheezing and good diaphragmatic motion Heart:  S1/S2 no murmur, no rub, gallop or click PMI normal Abdomen: benighn, BS positve, no tenderness, no AAA no bruit.  No HSM or HJR Distal pulses intact with no bruits No edema Neuro non-focal Skin warm and dry No muscular weakness   Current Outpatient Prescriptions  Medication Sig Dispense Refill  . Adalimumab (HUMIRA) 20 MG/0.4ML KIT Inject 40 mg into the skin See admin instructions. 2 times monthly    . chlorpheniramine-HYDROcodone (TUSSIONEX PENNKINETIC ER) 10-8 MG/5ML LQCR Take 5 mLs by mouth every 12 (twelve) hours as needed. 60 mL 0  . clobetasol cream (TEMOVATE) 3.14 % Apply 1 application topically 2 (two) times daily.    Marland Kitchen losartan  (COZAAR) 100 MG tablet Take 1 tablet (100 mg total) by mouth daily. 90 tablet 3  . Omeprazole (PRILOSEC PO) Take 1 tablet by mouth daily.    . pseudoephedrine-guaifenesin (MUCINEX D) 60-600 MG per tablet Take 1 tablet by mouth every 12 (twelve) hours. 18 tablet 0  . Red Yeast Rice Extract (RED YEAST RICE PO) Take 2 tablets by mouth daily.    . ST JOHNS WORT PO Take 2 tablets by mouth daily.    . vitamin C (ASCORBIC ACID) 500 MG tablet Take 1,000 mg by mouth daily.      No current facility-administered medications for this visit.    Allergies  Review of patient's allergies indicates no known allergies.  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

## 2014-09-14 ENCOUNTER — Ambulatory Visit (INDEPENDENT_AMBULATORY_CARE_PROVIDER_SITE_OTHER): Payer: BLUE CROSS/BLUE SHIELD | Admitting: Cardiovascular Disease

## 2014-09-14 ENCOUNTER — Encounter: Payer: Self-pay | Admitting: Cardiovascular Disease

## 2014-09-14 VITALS — BP 124/80 | HR 78 | Ht 71.0 in | Wt 187.4 lb

## 2014-09-14 DIAGNOSIS — I10 Essential (primary) hypertension: Secondary | ICD-10-CM

## 2014-09-14 DIAGNOSIS — I491 Atrial premature depolarization: Secondary | ICD-10-CM | POA: Diagnosis not present

## 2014-09-14 MED ORDER — ATENOLOL 25 MG PO TABS: 25.0000 mg | ORAL_TABLET | Freq: Every day | ORAL | Status: DC

## 2014-09-14 MED ORDER — LOSARTAN POTASSIUM 100 MG PO TABS: 100.0000 mg | ORAL_TABLET | Freq: Every day | ORAL | Status: DC

## 2014-09-14 NOTE — Patient Instructions (Signed)
Medication Instructions:  START  ATENOLOL  25 MG EVERY DAY   Labwork: NONE  Testing/Procedures: NONE  Follow-Up: Your physician recommends that you schedule a follow-up appointment in: NEXT AVAILABLE  WITH DR Johnsie Cancel   Any Other Special Instructions Will Be Listed Below (If Applicable).

## 2014-10-08 ENCOUNTER — Telehealth: Payer: Self-pay | Admitting: Cardiovascular Disease

## 2014-10-08 NOTE — Telephone Encounter (Signed)
New message      Talk to Altha Harm about trying a beta blocker.  He has concerns he would like to discuss

## 2014-10-08 NOTE — Telephone Encounter (Signed)
Ok

## 2014-10-08 NOTE — Telephone Encounter (Signed)
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

## 2014-10-08 NOTE — Telephone Encounter (Signed)
LEFT  VOICE MAIL  THAT DR  Johnsie Cancel IS  OKAY WITH  DECISION  WILL REEVALUATE  NEXT YEAR  ./CY

## 2014-11-02 NOTE — Progress Notes (Signed)
Patient ID: Chad Beasley, male   DOB: 11/29/1950, 64 y.o.   MRN: [id number] Chad Beasley is seen today for F/U of chest pain and HTN.  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 He has had a normal myuovue in 2006 and 2008. He has HTN which does not appear to be adequatley Rx He has not had frequent primary care F/U. He sees Education officer, environmental in [place]. He is on [place] for psoriasis and needs blood work for this especially a CBC with PLT. 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  All other systems reviewed and negative  General: Affect appropriate Healthy:  appears stated age 64: normal Neck supple with no adenopathy JVP normal no bruits no thyromegaly Lungs clear with no wheezing and good diaphragmatic motion Heart:  S1/S2 no murmur, no rub, gallop or click PMI normal Abdomen: benighn, BS positve, no tenderness, no AAA no bruit.  No HSM or HJR Distal pulses intact with no bruits No edema Neuro non-focal Skin warm and dry No muscular weakness   Current Outpatient Prescriptions  Medication Sig Dispense Refill  . Adalimumab (HUMIRA) 20 MG/0.4ML KIT Inject 40 mg into the skin See admin instructions. 2 times monthly    . atenolol (TENORMIN) 25 MG tablet Take 1 tablet (25 mg total) by mouth daily. 30 tablet 2  . chlorpheniramine-HYDROcodone (TUSSIONEX PENNKINETIC ER) 10-8 MG/5ML  LQCR Take 5 mLs by mouth every 12 (twelve) hours as needed. 60 mL 0  . clobetasol cream (TEMOVATE) 6.71 % Apply 1 application topically 2 (two) times daily.    Marland Kitchen losartan (COZAAR) 100 MG tablet Take 1 tablet (100 mg total) by mouth daily. 90 tablet 3  . Omeprazole (PRILOSEC PO) Take 1 tablet by mouth daily.    . pseudoephedrine-guaifenesin (MUCINEX D) 60-600 MG per tablet Take 1 tablet by mouth every 12 (twelve) hours. 18 tablet 0  . Red Yeast Rice Extract (RED YEAST RICE PO) Take 2 tablets by mouth daily.    . ST JOHNS WORT PO Take 2 tablets by mouth daily.    . vitamin C (ASCORBIC ACID) 500 MG tablet Take 1,000 mg by mouth daily.      No current facility-administered medications for this visit.    Allergies  Review of patient's allergies indicates no known allergies.  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

## 2014-11-05 ENCOUNTER — Encounter: Payer: BLUE CROSS/BLUE SHIELD | Admitting: Cardiovascular Disease

## 2015-06-28 DIAGNOSIS — L4050 Arthropathic psoriasis, unspecified: Secondary | ICD-10-CM | POA: Diagnosis not present

## 2015-06-28 DIAGNOSIS — L400 Psoriasis vulgaris: Secondary | ICD-10-CM | POA: Diagnosis not present

## 2015-06-30 DIAGNOSIS — H3563 Retinal hemorrhage, bilateral: Secondary | ICD-10-CM | POA: Diagnosis not present

## 2015-06-30 DIAGNOSIS — H33322 Round hole, left eye: Secondary | ICD-10-CM | POA: Diagnosis not present

## 2015-07-21 DIAGNOSIS — N451 Epididymitis: Secondary | ICD-10-CM | POA: Diagnosis not present

## 2015-07-21 DIAGNOSIS — N3020 Other chronic cystitis without hematuria: Secondary | ICD-10-CM | POA: Diagnosis not present

## 2015-07-21 DIAGNOSIS — N401 Enlarged prostate with lower urinary tract symptoms: Secondary | ICD-10-CM | POA: Diagnosis not present

## 2015-07-21 DIAGNOSIS — R100 Acute abdomen: Secondary | ICD-10-CM | POA: Diagnosis not present

## 2015-08-05 DIAGNOSIS — N400 Enlarged prostate without lower urinary tract symptoms: Secondary | ICD-10-CM | POA: Diagnosis not present

## 2015-08-05 DIAGNOSIS — N4340 Spermatocele of epididymis, unspecified: Secondary | ICD-10-CM | POA: Diagnosis not present

## 2015-08-05 DIAGNOSIS — R1030 Lower abdominal pain, unspecified: Secondary | ICD-10-CM | POA: Diagnosis not present

## 2015-08-05 DIAGNOSIS — N3020 Other chronic cystitis without hematuria: Secondary | ICD-10-CM | POA: Diagnosis not present

## 2015-08-05 DIAGNOSIS — N410 Acute prostatitis: Secondary | ICD-10-CM | POA: Diagnosis not present

## 2015-08-19 DIAGNOSIS — N318 Other neuromuscular dysfunction of bladder: Secondary | ICD-10-CM | POA: Diagnosis not present

## 2015-08-19 DIAGNOSIS — N3020 Other chronic cystitis without hematuria: Secondary | ICD-10-CM | POA: Diagnosis not present

## 2015-08-19 DIAGNOSIS — D294 Benign neoplasm of scrotum: Secondary | ICD-10-CM | POA: Diagnosis not present

## 2015-08-19 DIAGNOSIS — N453 Epididymo-orchitis: Secondary | ICD-10-CM | POA: Diagnosis not present

## 2015-08-19 DIAGNOSIS — N451 Epididymitis: Secondary | ICD-10-CM | POA: Diagnosis not present

## 2015-08-20 NOTE — Progress Notes (Signed)
Patient ID: Chad Beasley, male   DOB: October 18, 1950, 65 y.o.   MRN: [id number] Chad Beasley is seen today for F/U of chest pain and HTN.  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 He has had a normal myuovue in 2006 and 2008. He has HTN which does not appear to be adequatley Rx He has not had frequent primary care F/U. He sees Education officer, environmental in [place]. He is on [place] for psoriasis and needs blood work for this especially a CBC with PLT. 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  All other systems reviewed and negative  General: Affect appropriate Healthy:  appears stated age 65: normal Neck supple with no adenopathy JVP normal no bruits no thyromegaly Lungs clear with no wheezing and good diaphragmatic motion Heart:  S1/S2 no murmur, no rub, gallop or click PMI normal Abdomen: benighn, BS positve, no tenderness, no AAA no bruit.  No HSM or HJR Distal pulses intact with no bruits No edema Neuro non-focal Skin warm and dry No muscular weakness   Current Outpatient Prescriptions  Medication Sig Dispense Refill  . Adalimumab (HUMIRA) 20 MG/0.4ML KIT Inject 40 mg into the skin See admin instructions. 2 times monthly    .  chlorpheniramine-HYDROcodone (TUSSIONEX PENNKINETIC ER) 10-8 MG/5ML LQCR Take 5 mLs by mouth every 12 (twelve) hours as needed. 60 mL 0  . clobetasol cream (TEMOVATE) 1.69 % Apply 1 application topically 2 (two) times daily.    Marland Kitchen losartan (COZAAR) 100 MG tablet Take 1 tablet (100 mg total) by mouth daily. 90 tablet 3  . Omeprazole (PRILOSEC PO) Take 1 tablet by mouth daily.    . Red Yeast Rice Extract (RED YEAST RICE PO) Take 2 tablets by mouth daily.    . ST JOHNS WORT PO Take 2 tablets by mouth daily.    . vitamin C (ASCORBIC ACID) 500 MG tablet Take 1,000 mg by mouth daily.      No current facility-administered medications for this visit.    Allergies  Review of patient's allergies indicates no known allergies.  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

## 2015-08-23 ENCOUNTER — Encounter: Payer: Self-pay | Admitting: Cardiovascular Disease

## 2015-08-23 ENCOUNTER — Ambulatory Visit (INDEPENDENT_AMBULATORY_CARE_PROVIDER_SITE_OTHER): Payer: PPO | Admitting: Cardiovascular Disease

## 2015-08-23 VITALS — BP 120/90 | HR 67 | Ht 71.0 in | Wt 193.8 lb

## 2015-08-23 DIAGNOSIS — I493 Ventricular premature depolarization: Secondary | ICD-10-CM

## 2015-08-23 DIAGNOSIS — I10 Essential (primary) hypertension: Secondary | ICD-10-CM | POA: Diagnosis not present

## 2015-08-23 MED ORDER — LOSARTAN POTASSIUM 100 MG PO TABS: 100.0000 mg | ORAL_TABLET | Freq: Every day | ORAL | Status: DC

## 2015-08-23 NOTE — Patient Instructions (Addendum)

## 2015-08-26 DIAGNOSIS — J029 Acute pharyngitis, unspecified: Secondary | ICD-10-CM | POA: Diagnosis not present

## 2015-12-30 DIAGNOSIS — L821 Other seborrheic keratosis: Secondary | ICD-10-CM | POA: Diagnosis not present

## 2015-12-30 DIAGNOSIS — D225 Melanocytic nevi of trunk: Secondary | ICD-10-CM | POA: Diagnosis not present

## 2015-12-30 DIAGNOSIS — L400 Psoriasis vulgaris: Secondary | ICD-10-CM | POA: Diagnosis not present

## 2016-01-17 DIAGNOSIS — L255 Unspecified contact dermatitis due to plants, except food: Secondary | ICD-10-CM | POA: Diagnosis not present

## 2016-06-29 DIAGNOSIS — L820 Inflamed seborrheic keratosis: Secondary | ICD-10-CM | POA: Diagnosis not present

## 2016-06-29 DIAGNOSIS — L400 Psoriasis vulgaris: Secondary | ICD-10-CM | POA: Diagnosis not present

## 2016-06-29 DIAGNOSIS — D225 Melanocytic nevi of trunk: Secondary | ICD-10-CM | POA: Diagnosis not present

## 2016-06-29 DIAGNOSIS — Z85820 Personal history of malignant melanoma of skin: Secondary | ICD-10-CM | POA: Diagnosis not present

## 2016-08-10 ENCOUNTER — Encounter: Payer: Self-pay | Admitting: Cardiovascular Disease

## 2016-08-22 NOTE — Progress Notes (Signed)
Patient ID: Chad Beasley, male   DOB: [birth year], 66 y.o.   MRN: [id number] Chad Beasley is seen today for F/U of chest pain and HTN.  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 He has had a normal myuovue in 2006 and 2008.Marland Kitchen He is on [place] for psoriasis and needs blood work for this especially a CBC with PLT. 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  All other systems reviewed and negative  General: BP 130/70   Pulse (!) 58   Ht '5\' 11"'  (1.803 m)   Wt 188 lb (85.3 kg)   BMI 26.22 kg/m  Affect appropriate Healthy:  appears stated age 66: normal Neck supple with no adenopathy JVP normal no bruits no thyromegaly Lungs clear with no wheezing and good diaphragmatic motion Heart:  S1/S2 no murmur, no rub, gallop or click PMI normal Abdomen: benighn, BS positve, no tenderness, no AAA no bruit.  No HSM or HJR Distal pulses intact with no bruits No edema Neuro non-focal Skin warm and dry No muscular weakness    Current Outpatient Prescriptions  Medication Sig Dispense Refill  . Adalimumab (HUMIRA) 20 MG/0.4ML KIT Inject 40 mg into the skin See admin instructions. 2 times monthly    .  chlorpheniramine-HYDROcodone (TUSSIONEX PENNKINETIC ER) 10-8 MG/5ML LQCR Take 5 mLs by mouth every 12 (twelve) hours as needed. 60 mL 0  . clobetasol cream (TEMOVATE) 0.07 % Apply 1 application topically 2 (two) times daily.    Marland Kitchen losartan (COZAAR) 100 MG tablet Take 1 tablet (100 mg total) by mouth daily. 90 tablet 3  . Omeprazole (PRILOSEC PO) Take 1 tablet by mouth daily.    . vitamin C (ASCORBIC ACID) 500 MG tablet Take 1,000 mg by mouth daily.      No current facility-administered medications for this visit.     Allergies  Patient has no known allergies.  Electrocardiogram:   08/2013   SR rate 88  PAC PVC   09/14/14  SR rate 78  [place]  08/23/15  SR rate 65 normal  08/28/16 SR rate 59 normal  Assessment and Plan  PVC;s:  Normal EF no CAD  Patient declined beta blocker Rx last visit Concern about fatigue and working around heavy machinery Observe ECG Normal today   HTN:  Continue ARB  Chol:  Continue red yeast rice  Melenoma:  S/P removal from scalp and lower back f/u Derm q 3 months   Optho:  F/u Dr Baird Cancer recent detached retina on left eye  Urology:  F/u Darrall Dears in [place] for prostate   Psoriasis :  F/u derm blood work for [employer] per dermatology and primary   [name]

## 2016-08-28 ENCOUNTER — Encounter: Payer: Self-pay | Admitting: Cardiovascular Disease

## 2016-08-28 ENCOUNTER — Ambulatory Visit (INDEPENDENT_AMBULATORY_CARE_PROVIDER_SITE_OTHER): Payer: PPO | Admitting: Cardiovascular Disease

## 2016-08-28 DIAGNOSIS — I10 Essential (primary) hypertension: Secondary | ICD-10-CM | POA: Diagnosis not present

## 2016-08-28 MED ORDER — LOSARTAN POTASSIUM 100 MG PO TABS: 100.0000 mg | ORAL_TABLET | Freq: Every day | ORAL | 3 refills | Status: DC

## 2016-08-28 NOTE — Patient Instructions (Addendum)

## 2017-03-01 DIAGNOSIS — L400 Psoriasis vulgaris: Secondary | ICD-10-CM | POA: Diagnosis not present

## 2017-03-01 DIAGNOSIS — Z85820 Personal history of malignant melanoma of skin: Secondary | ICD-10-CM | POA: Diagnosis not present

## 2017-03-01 DIAGNOSIS — D225 Melanocytic nevi of trunk: Secondary | ICD-10-CM | POA: Diagnosis not present

## 2017-08-23 ENCOUNTER — Other Ambulatory Visit: Payer: Self-pay | Admitting: Cardiovascular Disease

## 2017-08-23 DIAGNOSIS — I10 Essential (primary) hypertension: Secondary | ICD-10-CM

## 2017-08-27 ENCOUNTER — Encounter: Payer: Self-pay | Admitting: Cardiovascular Disease

## 2017-08-28 DIAGNOSIS — L400 Psoriasis vulgaris: Secondary | ICD-10-CM | POA: Diagnosis not present

## 2017-08-28 DIAGNOSIS — R531 Weakness: Secondary | ICD-10-CM | POA: Diagnosis not present

## 2017-08-30 DIAGNOSIS — Z85820 Personal history of malignant melanoma of skin: Secondary | ICD-10-CM | POA: Diagnosis not present

## 2017-08-30 DIAGNOSIS — L400 Psoriasis vulgaris: Secondary | ICD-10-CM | POA: Diagnosis not present

## 2017-09-03 DIAGNOSIS — I10 Essential (primary) hypertension: Secondary | ICD-10-CM | POA: Diagnosis not present

## 2017-09-03 DIAGNOSIS — N529 Male erectile dysfunction, unspecified: Secondary | ICD-10-CM | POA: Diagnosis not present

## 2017-09-03 DIAGNOSIS — L409 Psoriasis, unspecified: Secondary | ICD-10-CM | POA: Diagnosis not present

## 2017-09-03 DIAGNOSIS — L659 Nonscarring hair loss, unspecified: Secondary | ICD-10-CM | POA: Diagnosis not present

## 2017-09-04 DIAGNOSIS — R7611 Nonspecific reaction to tuberculin skin test without active tuberculosis: Secondary | ICD-10-CM | POA: Diagnosis not present

## 2017-09-04 DIAGNOSIS — R7612 Nonspecific reaction to cell mediated immunity measurement of gamma interferon antigen response without active tuberculosis: Secondary | ICD-10-CM | POA: Diagnosis not present

## 2017-09-12 ENCOUNTER — Ambulatory Visit: Payer: PPO | Admitting: Cardiovascular Disease

## 2017-09-21 DIAGNOSIS — E291 Testicular hypofunction: Secondary | ICD-10-CM | POA: Diagnosis not present

## 2017-10-02 ENCOUNTER — Other Ambulatory Visit: Payer: Self-pay | Admitting: Cardiovascular Disease

## 2017-10-02 DIAGNOSIS — I10 Essential (primary) hypertension: Secondary | ICD-10-CM

## 2017-11-07 DIAGNOSIS — I10 Essential (primary) hypertension: Secondary | ICD-10-CM | POA: Diagnosis not present

## 2017-11-07 DIAGNOSIS — Z0000 Encounter for general adult medical examination without abnormal findings: Secondary | ICD-10-CM | POA: Diagnosis not present

## 2017-11-07 DIAGNOSIS — E291 Testicular hypofunction: Secondary | ICD-10-CM | POA: Diagnosis not present

## 2017-11-07 DIAGNOSIS — R002 Palpitations: Secondary | ICD-10-CM | POA: Diagnosis not present

## 2017-11-07 DIAGNOSIS — Z1211 Encounter for screening for malignant neoplasm of colon: Secondary | ICD-10-CM | POA: Diagnosis not present

## 2017-11-07 DIAGNOSIS — E785 Hyperlipidemia, unspecified: Secondary | ICD-10-CM | POA: Diagnosis not present

## 2017-11-07 DIAGNOSIS — Z125 Encounter for screening for malignant neoplasm of prostate: Secondary | ICD-10-CM | POA: Diagnosis not present

## 2017-12-13 NOTE — Progress Notes (Signed)
Patient ID: Chad Beasley, male   DOB: Feb 05, 1951, 67 y.o.   MRN: [id number] Branch is seen today for F/U of HTN and PAC;s / PVC;s  Had suggested beta blocker for arrhythmia but patient declined  He has had a normal myuovue in 2006 and 2008.Marland Kitchen He is on [place] for psoriasis and needs blood work for this especially a CBC with PLT. Not having any fatique, dyspnea, fever, palpatations or edema  Still having beers daily  PAC;s and PVC;s on exam  holdter reviewed 09/2013  PAC;s PVC;s one short run SVT Echo 09/24/13  EF 60-65%  No valve disease reviewed    It Now retired from [employer] sees urologist in [place]  Had detached retina April 2017  seen by Dr Baird Cancer and still some blurred vision   Rides a Harley Ultra Classic  ROS: Denies fever, malais, weight loss, blurry vision, decreased visual acuity, cough, sputum, SOB, hemoptysis, pleuritic pain, palpitaitons, heartburn, abdominal pain, melena, lower extremity edema, claudication, or rash.  All other systems reviewed and negative  General: BP 118/88   Pulse 62   Ht _0  (1.803 m)   Wt 184 lb 8 oz (83.7 kg)   SpO2 97%   BMI 25.73 kg/m  Affect appropriate Healthy:  appears stated age 17: normal Neck supple with no adenopathy JVP normal no bruits no thyromegaly Lungs clear with no wheezing and good diaphragmatic motion Heart:  S1/S2 no murmur, no rub, gallop or click PMI normal Abdomen: benighn, BS positve, no tenderness, no AAA no bruit.  No HSM or HJR Distal pulses intact with no bruits No edema Neuro non-focal Skin warm and dry No muscular weakness    Current Outpatient Medications  Medication Sig Dispense Refill  . Adalimumab (HUMIRA) 20 MG/0.4ML KIT Inject 40 mg into the skin See admin instructions. 2 times monthly    . clobetasol cream (TEMOVATE) 0.03 % Apply 1 application topically 2 (two) times daily.    Marland Kitchen losartan (COZAAR) 100 MG tablet TAKE 1 TABLET (100 MG TOTAL) BY MOUTH DAILY. PLEASE KEEP UPCOMING APPT  FOR FURTHER REFILLS. 30 tablet 2  . Omeprazole (PRILOSEC PO) Take 1 tablet by mouth daily.    . vitamin C (ASCORBIC ACID) 500 MG tablet Take 1,000 mg by mouth daily.      No current facility-administered medications for this visit.     Allergies  Patient has no known allergies.  Electrocardiogram:   12/17/17 SR rate 76 normal ECG read as septal infarct by computer  Assessment and Plan  PVC;s:  Normal EF no CAD Last echo 2015 will update   Patient declined beta blocker Rx last visit Concern about fatigue and working around heavy machinery Observe ECG Normal today   HTN:  Continue ARB  Chol:  Started on lipitor 10 mg by primary LDL 148 told him he could have calcium  Score if he is concerned about starting statin to see where he stands   Melenoma:  S/P removal from scalp and lower back f/u Derm q 3 months   Optho:  F/u Dr Baird Cancer history of left eye detached retina   Urology:  F/u Darrall Dears in [place] for prostate Told him ok to use Viagra for ED   Psoriasis :  F/u derm blood work for [employer] per dermatology and primary   Orders:  TTE   [name]

## 2017-12-17 ENCOUNTER — Encounter

## 2017-12-17 ENCOUNTER — Encounter: Payer: Self-pay | Admitting: Cardiovascular Disease

## 2017-12-17 ENCOUNTER — Ambulatory Visit: Payer: PPO | Admitting: Cardiovascular Disease

## 2017-12-17 VITALS — BP 118/88 | HR 62 | Ht 71.0 in | Wt 184.5 lb

## 2017-12-17 DIAGNOSIS — I10 Essential (primary) hypertension: Secondary | ICD-10-CM | POA: Diagnosis not present

## 2017-12-17 DIAGNOSIS — E785 Hyperlipidemia, unspecified: Secondary | ICD-10-CM | POA: Diagnosis not present

## 2017-12-17 DIAGNOSIS — I493 Ventricular premature depolarization: Secondary | ICD-10-CM | POA: Diagnosis not present

## 2017-12-17 NOTE — Patient Instructions (Addendum)

## 2017-12-26 ENCOUNTER — Other Ambulatory Visit: Payer: Self-pay

## 2017-12-26 ENCOUNTER — Ambulatory Visit (HOSPITAL_COMMUNITY): Payer: PPO | Attending: Cardiology

## 2017-12-26 DIAGNOSIS — I10 Essential (primary) hypertension: Secondary | ICD-10-CM

## 2017-12-26 DIAGNOSIS — E785 Hyperlipidemia, unspecified: Secondary | ICD-10-CM | POA: Diagnosis not present

## 2017-12-26 DIAGNOSIS — I493 Ventricular premature depolarization: Secondary | ICD-10-CM | POA: Diagnosis not present

## 2017-12-26 MED ORDER — LOSARTAN POTASSIUM 100 MG PO TABS: 100.0000 mg | ORAL_TABLET | Freq: Every day | ORAL | 3 refills | Status: DC

## 2017-12-26 NOTE — Progress Notes (Signed)
Refilled patient's losartan for 90 day supply

## 2018-06-06 DIAGNOSIS — L400 Psoriasis vulgaris: Secondary | ICD-10-CM | POA: Diagnosis not present

## 2018-09-11 DIAGNOSIS — L400 Psoriasis vulgaris: Secondary | ICD-10-CM | POA: Diagnosis not present

## 2018-12-15 ENCOUNTER — Other Ambulatory Visit: Payer: Self-pay | Admitting: Cardiovascular Disease

## 2018-12-15 DIAGNOSIS — I10 Essential (primary) hypertension: Secondary | ICD-10-CM

## 2019-01-02 NOTE — Progress Notes (Signed)
Patient ID: Chad Beasley, male   DOB: 1950/06/02, 68 y.o.   MRN: [id number]     Chad Beasley is seen today for F/U of HTN and PAC;s / PVC;s  Had suggested beta blocker for arrhythmia but patient declined  He has had a normal myuovue in 2006 and 2008.Marland Kitchen He is on [place] for psoriasis and needs blood work for this especially a CBC with PLT. Not having any fatique, dyspnea, fever, palpatations or edema  Still having beers daily  PAC;s and PVC;s on exam  holdter reviewed 09/2013  PAC;s PVC;s one short run SVT Echo 09/24/13  EF 60-65%  No valve disease reviewed  Echo 12/26/17 EF 55-60%    Now retired from [employer] sees urologist in [place]  Had detached retina April 2017  seen by Dr Chad Beasley and still some blurred vision   Rides a [employer]  Had an episode of rapid HR and low BP 2 weeks ago lasted 5 hours and has not returned Seemed to start with rapid change in head position doing yard work   ROS: Denies fever, malais, weight loss, blurry vision, decreased visual acuity, cough, sputum, SOB, hemoptysis, pleuritic pain, palpitaitons, heartburn, abdominal pain, melena, lower extremity edema, claudication, or rash.  All other systems reviewed and negative  General: BP 130/82   Pulse (!) 57   Ht '5\' 11"'  (1.803 m)   Wt 180 lb (81.6 kg)   SpO2 99%   BMI 25.10 kg/m  Affect appropriate Healthy:  appears stated age 68: normal Neck supple with no adenopathy JVP normal no bruits no thyromegaly Lungs clear with no wheezing and good diaphragmatic motion Heart:  S1/S2 no murmur, no rub, gallop or click PMI normal Abdomen: benighn, BS positve, no tenderness, no AAA no bruit.  No HSM or HJR Distal pulses intact with no bruits No edema Neuro non-focal Skin warm and dry No muscular weakness    Current Outpatient Medications  Medication Sig Dispense Refill  . Adalimumab (HUMIRA) 20 MG/0.4ML KIT Inject 40 mg into the skin See admin instructions. 2 times monthly    .  clobetasol cream (TEMOVATE) 2.33 % Apply 1 application topically 2 (two) times daily.    Marland Kitchen losartan (COZAAR) 100 MG tablet TAKE 1 TABLET BY MOUTH EVERY DAY 30 tablet 0  . Omeprazole (PRILOSEC PO) Take 1 tablet by mouth daily.    . vitamin C (ASCORBIC ACID) 500 MG tablet Take 1,000 mg by mouth daily.      No current facility-administered medications for this visit.     Allergies  Patient has no known allergies.  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

## 2019-01-07 ENCOUNTER — Ambulatory Visit (INDEPENDENT_AMBULATORY_CARE_PROVIDER_SITE_OTHER): Payer: PPO | Admitting: Cardiovascular Disease

## 2019-01-07 ENCOUNTER — Encounter: Payer: Self-pay | Admitting: Cardiovascular Disease

## 2019-01-07 ENCOUNTER — Other Ambulatory Visit: Payer: Self-pay

## 2019-01-07 VITALS — BP 130/82 | HR 57 | Ht 71.0 in | Wt 180.0 lb

## 2019-01-07 DIAGNOSIS — I493 Ventricular premature depolarization: Secondary | ICD-10-CM

## 2019-01-07 DIAGNOSIS — E785 Hyperlipidemia, unspecified: Secondary | ICD-10-CM

## 2019-01-07 NOTE — Patient Instructions (Addendum)
Medication Instructions:   *If you need a refill on your cardiac medications before your next appointment, please call your pharmacy*  Lab Work:  If you have labs (blood work) drawn today and your tests are completely normal, you will receive your results only by: Marland Kitchen MyChart Message (if you have MyChart) OR . A paper copy in the mail If you have any lab test that is abnormal or we need to change your treatment, we will call you to review the results.  Testing/Procedures: Cardiac CT scanning for calcium score, (CAT scanning), is a noninvasive, special x-ray that produces cross-sectional images of the body using x-rays and a computer. CT scans help physicians diagnose and treat medical conditions. For some CT exams, a contrast material is used to enhance visibility in the area of the body being studied. CT scans provide greater clarity and reveal more details than regular x-ray exams.  Follow-Up: At [facility], you and your health needs are our priority.  As part of our continuing mission to provide you with exceptional heart care, we have created designated Provider Care Teams.  These Care Teams include your primary Cardiologist (physician) and Advanced Practice Providers (APPs -  Physician Assistants and Nurse Practitioners) who all work together to provide you with the care you need, when you need it.  Your next appointment:   6 months  The format for your next appointment:   In Person  Provider:   You may see [name], MD or one of the following Advanced Practice Providers on your designated Care Team:    Truitt Merle, NP  Cecilie Kicks, NP  Kathyrn Drown, NP

## 2019-01-12 ENCOUNTER — Other Ambulatory Visit: Payer: Self-pay | Admitting: Cardiovascular Disease

## 2019-01-12 DIAGNOSIS — I10 Essential (primary) hypertension: Secondary | ICD-10-CM

## 2019-02-01 DIAGNOSIS — L400 Psoriasis vulgaris: Secondary | ICD-10-CM | POA: Diagnosis not present

## 2019-02-01 DIAGNOSIS — D485 Neoplasm of uncertain behavior of skin: Secondary | ICD-10-CM | POA: Diagnosis not present

## 2019-02-01 DIAGNOSIS — L4050 Arthropathic psoriasis, unspecified: Secondary | ICD-10-CM | POA: Diagnosis not present

## 2019-02-01 DIAGNOSIS — D225 Melanocytic nevi of trunk: Secondary | ICD-10-CM | POA: Diagnosis not present

## 2019-02-04 ENCOUNTER — Ambulatory Visit (INDEPENDENT_AMBULATORY_CARE_PROVIDER_SITE_OTHER)
Admission: RE | Admit: 2019-02-04 | Discharge: 2019-02-04 | Disposition: A | Discharge status: Home / Self Care | Payer: Self-pay | Source: Ambulatory Visit | Attending: Cardiovascular Disease | Admitting: Cardiovascular Disease

## 2019-02-04 ENCOUNTER — Other Ambulatory Visit: Payer: Self-pay

## 2019-02-04 DIAGNOSIS — E785 Hyperlipidemia, unspecified: Secondary | ICD-10-CM

## 2019-02-04 DIAGNOSIS — I493 Ventricular premature depolarization: Secondary | ICD-10-CM

## 2019-04-16 DIAGNOSIS — K6289 Other specified diseases of anus and rectum: Secondary | ICD-10-CM | POA: Diagnosis not present

## 2019-04-16 DIAGNOSIS — E785 Hyperlipidemia, unspecified: Secondary | ICD-10-CM | POA: Diagnosis not present

## 2019-04-21 DIAGNOSIS — K611 Rectal abscess: Secondary | ICD-10-CM | POA: Diagnosis not present

## 2019-04-21 DIAGNOSIS — K644 Residual hemorrhoidal skin tags: Secondary | ICD-10-CM | POA: Diagnosis not present

## 2019-04-24 DIAGNOSIS — K611 Rectal abscess: Secondary | ICD-10-CM | POA: Diagnosis not present

## 2019-04-24 DIAGNOSIS — K649 Unspecified hemorrhoids: Secondary | ICD-10-CM | POA: Diagnosis not present

## 2019-05-06 DIAGNOSIS — D485 Neoplasm of uncertain behavior of skin: Secondary | ICD-10-CM | POA: Diagnosis not present

## 2019-05-06 DIAGNOSIS — L400 Psoriasis vulgaris: Secondary | ICD-10-CM | POA: Diagnosis not present

## 2019-05-06 DIAGNOSIS — D225 Melanocytic nevi of trunk: Secondary | ICD-10-CM | POA: Diagnosis not present

## 2019-05-08 DIAGNOSIS — K611 Rectal abscess: Secondary | ICD-10-CM | POA: Diagnosis not present

## 2019-05-19 DIAGNOSIS — K611 Rectal abscess: Secondary | ICD-10-CM | POA: Diagnosis not present

## 2019-06-06 DIAGNOSIS — K611 Rectal abscess: Secondary | ICD-10-CM | POA: Diagnosis not present

## 2019-07-23 ENCOUNTER — Ambulatory Visit: Payer: Self-pay | Admitting: General Surgery

## 2019-07-23 DIAGNOSIS — K603 Anal fistula: Secondary | ICD-10-CM | POA: Diagnosis not present

## 2019-07-23 NOTE — H&P (Signed)
The patient is a 69 year old male who presents with a perirectal abscess. Patient was diagnosed with a perirectal abscess approximately 2 months ago. He was placed on antibiotics and an I&D was performed. His symptoms resolved, but he continued to have recurrent episodes of purulent drainage and pain. He was seen in urgent office and referred to me for continued follow-up. Patient has no history of anal rectal disease. He has regular bowel habits.   Problem List/Past Medical Leighton Ruff, MD; 05/25/8097 11:39 AM) Kathi Der ABSCESS (K61.1) ANAL FISTULA (K60.3)  Past Surgical History Leighton Ruff, MD; 09/23/3823 11:39 AM) Appendectomy Vasectomy  Diagnostic Studies History Leighton Ruff, MD; 0/06/3974 11:39 AM) Colonoscopy never  Allergies Mammie Lorenzo, LPN; 08/23/4191 79:02 AM) No Known Drug Allergies [05/19/2019]:  Medication History Mammie Lorenzo, LPN; 4/0/9735 32:99 AM) Losartan Potassium (100MG Tablet, Oral) Active. Centrum Silver 50+Men (Oral) Active. Omeprazole (20MG Capsule DR, Oral) Active. Humira (40MG/0.8ML Prefill Syr Kit, Subcutaneous) Active. Medications Reconciled  Social History Leighton Ruff, MD; 03/26/2681 11:39 AM) Alcohol use Moderate alcohol use. Caffeine use Carbonated beverages, Coffee. No drug use Tobacco use Never smoker.  Family History Leighton Ruff, MD; 05/21/9620 11:39 AM) Family history unknown First Degree Relatives  Other Problems Leighton Ruff, MD; 04/01/7987 11:39 AM) Gastroesophageal Reflux Disease Hemorrhoids High blood pressure Hypercholesterolemia     Review of Systems Leighton Ruff MD; 03/24/1939 11:39 AM) General Not Present- Appetite Loss, Chills, Fatigue, Fever, Night Sweats, Weight Gain and Weight Loss. HEENT Not Present- Earache, Hearing Loss, Hoarseness, Nose Bleed, Oral Ulcers, Ringing in the Ears, Seasonal Allergies, Sinus Pain, Sore Throat, Visual Disturbances, Wears glasses/contact lenses and Yellow  Eyes. Respiratory Not Present- Bloody sputum, Chronic Cough, Difficulty Breathing, Snoring and Wheezing. Cardiovascular Not Present- Chest Pain, Difficulty Breathing Lying Down, Leg Cramps, Palpitations, Rapid Heart Rate, Shortness of Breath and Swelling of Extremities. Gastrointestinal Present- Hemorrhoids. Not Present- Abdominal Pain, Bloating, Bloody Stool, Change in Bowel Habits, Chronic diarrhea, Constipation, Difficulty Swallowing, Excessive gas, Gets full quickly at meals, Indigestion, Nausea, Rectal Pain and Vomiting. Male Genitourinary Not Present- Blood in Urine, Change in Urinary Stream, Frequency, Impotence, Nocturia, Painful Urination, Urgency and Urine Leakage. Musculoskeletal Not Present- Back Pain, Joint Pain, Joint Stiffness, Muscle Pain, Muscle Weakness and Swelling of Extremities. Neurological Not Present- Decreased Memory, Fainting, Headaches, Numbness, Seizures, Tingling, Tremor, Trouble walking and Weakness. Psychiatric Not Present- Anxiety, Bipolar, Change in Sleep Pattern, Depression, Fearful and Frequent crying. Endocrine Not Present- Cold Intolerance, Excessive Hunger, Hair Changes, Heat Intolerance and New Diabetes. Hematology Not Present- Blood Thinners, Easy Bruising, Excessive bleeding, Gland problems, HIV and Persistent Infections.  Vitals Claiborne Billings Dockery LPN; 08/23/812 48:18 AM) 07/23/2019 11:22 AM Weight: 184.8 lb Height: 71in Body Surface Area: 2.04 m Body Mass Index: 25.77 kg/m  Temp.: 97.12F(Thermal Scan)  Pulse: 111 (Regular)  BP: 120/66(Sitting, Right Arm, Standard)        Physical Exam Leighton Ruff MD; 06/26/3147 11:39 AM)  General Mental Status-Alert. General Appearance-Cooperative.  Rectal Anorectal Exam External - normal external exam. Note: Palpable scar noted in the right anterior perianal region. There is a small pinpoint opening draining yellow fluid when pressing firmly on the surrounding tissue. No cellulitis. No  induration. Internal - normal internal exam.    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 On exam, this is in the right anterior, just lateral to midline. It appears to be shallow and  superficial. I think most likely he will need a fistulotomy to fix his fistula. We did discussed the criteria for placing a seton, which is mainly involvement of more than 20% of the sphincter complex. We discussed typical postoperative recovery and activity levels. We discussed recurrence rates. All questions were answered.

## 2019-08-01 DIAGNOSIS — L400 Psoriasis vulgaris: Secondary | ICD-10-CM | POA: Diagnosis not present

## 2019-08-01 DIAGNOSIS — R531 Weakness: Secondary | ICD-10-CM | POA: Diagnosis not present

## 2019-09-04 NOTE — Progress Notes (Signed)
Patient ID: Chad Beasley, male   DOB: Dec 10, 1950, 69 y.o.   MRN: [id number]     Chad Beasley is seen today for F/U of HTN and PAC;s / PVC;s  Had suggested beta blocker for arrhythmia but patient declined  He has had a normal myuovue in 2006 and 2008.Marland Kitchen He is on [place] for psoriasis and needs blood work for this especially a CBC with PLT. Not having any fatique, dyspnea, fever, palpatations or edema  Still having beers daily  PAC;s and PVC;s on exam  holdter reviewed 09/2013  PAC;s PVC;s one short run SVT Echo 09/24/13  EF 60-65%  No valve disease reviewed  Echo 12/26/17 EF 55-60%    Now retired from [employer] sees urologist in [place]  Had detached retina April 2017  seen by Chad Chad Beasley and still some blurred vision   Rides a [employer]  Recent issues with peri rectal abscess seen by Chad Beasley surgery and suggested he have fistulotomy   Has not been vaccinated and had long discussion about this   ROS: Denies fever, malais, weight loss, blurry vision, decreased visual acuity, cough, sputum, SOB, hemoptysis, pleuritic pain, palpitaitons, heartburn, abdominal pain, melena, lower extremity edema, claudication, or rash.  All other systems reviewed and negative  General: BP 118/78   Pulse 80   Ht _0  (1.803 m)   Wt 184 lb 3.2 oz (83.6 kg)   SpO2 98%   BMI 25.69 kg/m  Affect appropriate Healthy:  appears stated age 7: normal Neck supple with no adenopathy JVP normal no bruits no thyromegaly Lungs clear with no wheezing and good diaphragmatic motion Heart:  S1/S2 no murmur, no rub, gallop or click PMI normal Abdomen: benighn, BS positve, no tenderness, no AAA no bruit.  No HSM or HJR Distal pulses intact with no bruits No edema Neuro non-focal Skin warm and dry No muscular weakness    Current Outpatient Medications  Medication Sig Dispense Refill  . Adalimumab (HUMIRA) 20 MG/0.4ML KIT Inject 40 mg into the skin See admin instructions. 2 times monthly      . atorvastatin (LIPITOR) 10 MG tablet Take 10 mg by mouth daily.    . clobetasol cream (TEMOVATE) 9.67 % Apply 1 application topically 2 (two) times daily.    Marland Kitchen losartan (COZAAR) 100 MG tablet TAKE 1 TABLET BY MOUTH EVERY DAY 90 tablet 3  . Omeprazole (PRILOSEC PO) Take 1 tablet by mouth daily.    . vitamin C (ASCORBIC ACID) 500 MG tablet Take 1,000 mg by mouth daily.      No current facility-administered medications for this visit.    Allergies  Patient has no known allergies.  Electrocardiogram:   01/07/19 SR rate 57 normal   Assessment and Plan  PVC;s:  Normal EF no CAD   Patient declined beta blocker Rx last visit Concern about fatigue  Observe ECG Normal today EF 55-60% TTE 12/26/17   HTN:  Continue ARB  Chol:  Given script for lipitor by primary but did not start  LDL 148  With calcium score of 153 02/04/19 54 th percentile has been on low dose lipitor for 2 months f/u labs in a month   Melenoma:  S/P removal from scalp and lower back f/u Derm q 3 months   Optho:  F/u Chad Chad Beasley history of left eye detached retina   Urology:  F/u Chad Beasley in [place] for prostate Told him ok to use Viagra for ED   Psoriasis :  F/u  derm blood work for [employer] per dermatology and primary   Orders:  Lipid and liver in 4 weeks F/U in a year   [name]

## 2019-09-12 ENCOUNTER — Encounter: Payer: Self-pay | Admitting: Cardiovascular Disease

## 2019-09-12 ENCOUNTER — Ambulatory Visit: Payer: PPO | Admitting: Cardiovascular Disease

## 2019-09-12 ENCOUNTER — Other Ambulatory Visit: Payer: Self-pay

## 2019-09-12 VITALS — BP 118/78 | HR 80 | Ht 71.0 in | Wt 184.2 lb

## 2019-09-12 DIAGNOSIS — E785 Hyperlipidemia, unspecified: Secondary | ICD-10-CM

## 2019-09-12 DIAGNOSIS — I493 Ventricular premature depolarization: Secondary | ICD-10-CM | POA: Diagnosis not present

## 2019-09-12 NOTE — Patient Instructions (Addendum)
Medication Instructions:  *If you need a refill on your cardiac medications before your next appointment, please call your pharmacy*  Lab Work: Your physician recommends that you return for lab work in: 1 month with fasting lipid and liver  If you have labs (blood work) drawn today and your tests are completely normal, you will receive your results only by: Marland Kitchen MyChart Message (if you have MyChart) OR . A paper copy in the mail If you have any lab test that is abnormal or we need to change your treatment, we will call you to review the results.  Follow-Up: At [facility], you and your health needs are our priority.  As part of our continuing mission to provide you with exceptional heart care, we have created designated Provider Care Teams.  These Care Teams include your primary Cardiologist (physician) and Advanced Practice Providers (APPs -  Physician Assistants and Nurse Practitioners) who all work together to provide you with the care you need, when you need it.  We recommend signing up for the patient portal called "MyChart".  Sign up information is provided on this After Visit Summary.  MyChart is used to connect with patients for Virtual Visits (Telemedicine).  Patients are able to view lab/test results, encounter notes, upcoming appointments, etc.  Non-urgent messages can be sent to your provider as well.   To learn more about what you can do with MyChart, go to [email or web address].    Your next appointment:   12 month(s)  The format for your next appointment:   In Person  Provider:   You may see [name], MD or one of the following Advanced Practice Providers on your designated Care Team:    Truitt Merle, NP  Cecilie Kicks, NP  Kathyrn Drown, NP

## 2019-10-13 ENCOUNTER — Other Ambulatory Visit: Payer: Self-pay

## 2019-10-13 ENCOUNTER — Other Ambulatory Visit: Payer: PPO

## 2019-10-13 DIAGNOSIS — E785 Hyperlipidemia, unspecified: Secondary | ICD-10-CM

## 2019-10-13 LAB — HEPATIC FUNCTION PANEL
ALT: 19 IU/L (ref 0–44)
AST: 29 IU/L (ref 0–40)
Albumin: 4.6 g/dL (ref 3.8–4.8)
Alkaline Phosphatase: 99 IU/L (ref 48–121)
Bilirubin Total: 1 mg/dL (ref 0.0–1.2)
Bilirubin, Direct: 0.25 mg/dL (ref 0.00–0.40)
Total Protein: 7.5 g/dL (ref 6.0–8.5)

## 2019-10-13 LAB — LIPID PANEL
Chol/HDL Ratio: 2.8 ratio (ref 0.0–5.0)
Cholesterol, Total: 201 mg/dL — ABNORMAL HIGH (ref 100–199)
HDL: 72 mg/dL (ref >39)
LDL Chol Calc (NIH): 104 mg/dL — ABNORMAL HIGH (ref 0–99)
Triglycerides: 144 mg/dL (ref 0–149)
VLDL Cholesterol Cal: 25 mg/dL (ref 5–40)

## 2019-10-20 ENCOUNTER — Telehealth: Payer: Self-pay | Admitting: Cardiovascular Disease

## 2019-10-20 NOTE — Telephone Encounter (Signed)
Per Dr. Acie Fredrickson, LDL not at goal 70 or less with high calcium score increase lipitor to 20 mg daily and repeat labs in 3 months. Patient stated he would like for his PCP to prescribe his lipitor and get lab work with him. Will forward results to patient's PCP.

## 2019-10-20 NOTE — Telephone Encounter (Signed)
Follow Up: ° ° °Pt is returning your call from today.. °

## 2019-10-21 DIAGNOSIS — J343 Hypertrophy of nasal turbinates: Secondary | ICD-10-CM | POA: Diagnosis not present

## 2019-10-21 DIAGNOSIS — J329 Chronic sinusitis, unspecified: Secondary | ICD-10-CM | POA: Diagnosis not present

## 2019-10-24 ENCOUNTER — Other Ambulatory Visit: Payer: Self-pay

## 2019-10-24 ENCOUNTER — Telehealth: Payer: Self-pay | Admitting: Cardiovascular Disease

## 2019-10-24 DIAGNOSIS — E785 Hyperlipidemia, unspecified: Secondary | ICD-10-CM

## 2019-10-24 MED ORDER — ATORVASTATIN CALCIUM 20 MG PO TABS
20.0000 mg | ORAL_TABLET | Freq: Every day | ORAL | 3 refills | Status: DC
Start: 2019-10-24 — End: 2020-10-15

## 2019-10-24 NOTE — Progress Notes (Signed)
Lab results from 8/23 mailed to patient per his request.

## 2019-10-24 NOTE — Telephone Encounter (Signed)
Follow up:     Patient calling back concering his medication change  Please call patient.

## 2019-10-24 NOTE — Telephone Encounter (Signed)
Patient is calling to follow up with Dr. Kyla Balzarine nurse. He states he is requesting to have Dr. Johnsie Cancel prescribe the atorvastatin (LIPITOR) 20 MG tablet. instead of his PCP Patient states he would also like a physical copy of his lab results.

## 2019-10-24 NOTE — Telephone Encounter (Signed)
Chad Hector, MD  10/20/2019 11:55 AM EDT     LDL not at goal 70 or less with high calcium score increase lipitor to 20 mg daily and repeat labs in 3 months    Patient aware of the above results and originally wanted PCP to manage but has now decided he prefers Dr. Johnsie Cancel to manage, asking for lab appt and new Lipitor Rx. 20 mg daily Lipitor Rx sent. Lab appt made for 01/10/20. Pt requested hard copy of lab work-printed and placed in outgoing mail.

## 2019-11-04 DIAGNOSIS — L400 Psoriasis vulgaris: Secondary | ICD-10-CM | POA: Diagnosis not present

## 2019-11-04 DIAGNOSIS — D225 Melanocytic nevi of trunk: Secondary | ICD-10-CM | POA: Diagnosis not present

## 2019-11-04 DIAGNOSIS — L570 Actinic keratosis: Secondary | ICD-10-CM | POA: Diagnosis not present

## 2020-01-07 DIAGNOSIS — J329 Chronic sinusitis, unspecified: Secondary | ICD-10-CM | POA: Diagnosis not present

## 2020-01-20 ENCOUNTER — Other Ambulatory Visit: Payer: PPO | Admitting: *Deleted

## 2020-01-20 ENCOUNTER — Other Ambulatory Visit: Payer: Self-pay

## 2020-01-20 DIAGNOSIS — E785 Hyperlipidemia, unspecified: Secondary | ICD-10-CM

## 2020-01-20 LAB — HEPATIC FUNCTION PANEL
ALT: 30 IU/L (ref 0–44)
AST: 37 IU/L (ref 0–40)
Albumin: 4.5 g/dL (ref 3.8–4.8)
Alkaline Phosphatase: 90 IU/L (ref 44–121)
Bilirubin Total: 0.6 mg/dL (ref 0.0–1.2)
Bilirubin, Direct: 0.2 mg/dL (ref 0.00–0.40)
Total Protein: 7.5 g/dL (ref 6.0–8.5)

## 2020-01-20 LAB — LIPID PANEL
Chol/HDL Ratio: 2.4 ratio (ref 0.0–5.0)
Cholesterol, Total: 153 mg/dL (ref 100–199)
HDL: 65 mg/dL (ref >39)
LDL Chol Calc (NIH): 54 mg/dL (ref 0–99)
Triglycerides: 213 mg/dL — ABNORMAL HIGH (ref 0–149)
VLDL Cholesterol Cal: 34 mg/dL (ref 5–40)

## 2020-01-22 ENCOUNTER — Telehealth: Payer: Self-pay | Admitting: Cardiovascular Disease

## 2020-01-22 ENCOUNTER — Other Ambulatory Visit: Payer: Self-pay | Admitting: Cardiovascular Disease

## 2020-01-22 DIAGNOSIS — I10 Essential (primary) hypertension: Secondary | ICD-10-CM

## 2020-01-22 NOTE — Telephone Encounter (Signed)
The patient has been notified of the result and verbalized understanding.  All questions (if any) were answered. Michaelyn Barter, RN 01/22/2020 3:36 PM

## 2020-01-22 NOTE — Telephone Encounter (Signed)
Pt called in returning Pam's call for most recent lab results  Best number [phone]

## 2020-07-12 ENCOUNTER — Telehealth: Payer: Self-pay | Admitting: Cardiovascular Disease

## 2020-07-12 NOTE — Telephone Encounter (Signed)
Pt called in and stated he would like to make fu with Dr Johnsie Cancel, He refused to sch a VT appt and Refused an appt with a APP.  He wants to see Dr Johnsie Cancel IN PERSON ONLY.  Please advise , thank you   Best number [phone]

## 2020-07-12 NOTE — Progress Notes (Signed)
Cardiology Office Note   Date:  07/13/2020   ID:  [name], [name] April 12, 1950, MRN [id number]  PCP:  Chad Pepper, MD    No chief complaint on file.  palpitations  Wt Readings from Last 3 Encounters:  07/13/20 182 lb (82.6 kg)  09/12/19 184 lb 3.2 oz (83.6 kg)  01/07/19 180 lb (81.6 kg)       History of Present Illness: Chad Beasley is a 70 y.o. male  Who I an seeing as DOD.  He sees Dr. Johnsie Beasley, "With HTN and PAC;s / PVC;s.  Had suggested beta blocker for arrhythmia but patient declined  He has had a normal myuovue in 2006 and 2008.Marland Kitchen He is on [place] for psoriasis and needs blood work for this especially a CBC with PLT. Not having any fatique, dyspnea, fever, palpatations or edema  Still having beers daily  PAC;s and PVC;s on exam  holdter reviewed 09/2013  PAC;s PVC;s one short run SVT Echo 09/24/13  EF 60-65%  No valve disease reviewed  Echo 12/26/17 EF 55-60%    Now retired from [employer] sees urologist in [place]  Had detached retina April 2017  seen by Dr Chad Beasley and still some blurred vision   Rides a [employer]  Recent issues with peri rectal abscess seen by Dr Chad Beasley surgery and suggested he have fistulotomy."  Declined COVID vaccines in the past.  Reported chest pain and palpitations for the past few days.   He checked his pulse and upon waking it was 89 but it goes up to 111 bpm.  He feels some skips.    Denies :  Leg edema. Nitroglycerin use. Orthopnea. Paroxysmal nocturnal dyspnea. Shortness of breath. Syncope.   Has some orthostatic symptoms.  Random chest cramp feeling lasting a few seconds at a time, resolves spontaneously.   He did not drink a lot of water Saturday.  He drank more on Sunday and his HR went down.  He does drink 2 cups of coffee daily.     Past Medical History:  Diagnosis Date  . CHEST PAIN-UNSPECIFIED   . HYPERTENSION   . PAC   . PSORIASIS     Past Surgical History:  Procedure Laterality Date  .  APPENDECTOMY  80's  . removal of melanoma  2016     Current Outpatient Medications  Medication Sig Dispense Refill  . Adalimumab 20 MG/0.4ML KIT Inject 40 mg into the skin See admin instructions. 2 times monthly    . atorvastatin (LIPITOR) 20 MG tablet Take 1 tablet (20 mg total) by mouth daily. 90 tablet 3  . clobetasol cream (TEMOVATE) 2.53 % Apply 1 application topically 2 (two) times daily.    Marland Kitchen losartan (COZAAR) 100 MG tablet TAKE 1 TABLET BY MOUTH EVERY DAY 90 tablet 2  . Multiple Vitamins-Minerals (CENTRUM SILVER 50+MEN) TABS Take 1 tablet by mouth daily.    . Omeprazole (PRILOSEC PO) Take 1 tablet by mouth daily.    . vitamin C (ASCORBIC ACID) 500 MG tablet Take 1,000 mg by mouth daily.      No current facility-administered medications for this visit.    Allergies:   Patient has no known allergies.    Social History:  The patient  reports that he has never smoked. He has never used smokeless tobacco. He reports that he does not drink alcohol and does not use drugs.   Family History:  The patient's family history includes Anemia in his father; Beasley in  his sister.    ROS:  Please see the history of present illness.   Otherwise, review of systems are positive for palpitations.   All other systems are reviewed and negative.    PHYSICAL EXAM: VS:  BP 116/78   Pulse (!) 105   Ht '5\' 11"'  (1.803 m)   Wt 182 lb (82.6 kg)   BMI 25.38 kg/m  , BMI Body mass index is 25.38 kg/m. GEN: Well nourished, well developed, in no acute distress  HEENT: normal  Neck: no JVD, carotid bruits, or masses Cardiac: RRR; no murmurs, rubs, or gallops,no edema  Respiratory:  clear to auscultation bilaterally, normal work of breathing GI: soft, nontender, nondistended, + BS MS: no deformity or atrophy  Skin: warm and dry, no rash Neuro:  Strength and sensation are intact Psych: euthymic mood, full affect   EKG:   The ekg ordered today demonstrates sinus tachycardia, no ST segment  changes   Recent Labs: 01/20/2020: ALT 30   Lipid Panel    Component Value Date/Time   CHOL 153 01/20/2020 0730   TRIG 213 (H) 01/20/2020 0730   HDL 65 01/20/2020 0730   CHOLHDL 2.4 01/20/2020 0730   LDLCALC 54 01/20/2020 0730     Other studies Reviewed: Additional studies/ records that were reviewed today with results demonstrating: prior records reviewed.   ASSESSMENT AND PLAN:  1. Precordial chest pain: Very atypical.  No ischemic evaluation planned at this time. 2. PVCs: Minimal symptoms.  Sinus tachycardia noted on today's ECG.  Unclear why he is tachycardic.  I encouraged him to stay well-hydrated.  Decrease caffeine intake.  We will check CBC, c-Met and TSH as well given his palpitations and tachycardia at rest.  Rule out anemia or thyroid issues. 3. HTN: The current medical regimen is effective;  continue present plan and medications. COntinue losartan. Stay well hydrated.  4. Hyperlipidemia: Continue atorvastatin.  LDL 54 in 2021.    Current medicines are reviewed at length with the patient today.  The patient concerns regarding his medicines were addressed.  The following changes have been made:  No change  Labs/ tests ordered today include:   Orders Placed This Encounter  Procedures  . CBC  . TSH  . Comp Met (CMET)  . EKG 12-Lead    Recommend 150 minutes/week of aerobic exercise Low fat, low carb, high fiber diet recommended  Disposition:   FU with Dr. Johnsie Beasley   Signed, Chad Grooms, MD  07/13/2020 11:37 AM    [facility] [place], [place], [place]  [address] Phone: [phone]; Fax: [phone]

## 2020-07-12 NOTE — Telephone Encounter (Signed)
The patient is complaining of ~BP 140/90, HR 100- 120, and palpitations for the past few days. Patient had squeezing pain in his chest this morning around 8 am. He had an episode yesterday as well. Hasn't missed any medications. He is not having chest pain at this time. Gave ED precautions. Set up schedule 5/24 at 11 am. Patient in agreement and verbalized understanding.

## 2020-07-13 ENCOUNTER — Other Ambulatory Visit: Payer: Self-pay

## 2020-07-13 ENCOUNTER — Ambulatory Visit: Payer: PPO | Admitting: Interventional Cardiology

## 2020-07-13 ENCOUNTER — Encounter: Payer: Self-pay | Admitting: Interventional Cardiology

## 2020-07-13 VITALS — BP 116/78 | HR 105 | Ht 71.0 in | Wt 182.0 lb

## 2020-07-13 DIAGNOSIS — I493 Ventricular premature depolarization: Secondary | ICD-10-CM

## 2020-07-13 DIAGNOSIS — R002 Palpitations: Secondary | ICD-10-CM | POA: Diagnosis not present

## 2020-07-13 DIAGNOSIS — E785 Hyperlipidemia, unspecified: Secondary | ICD-10-CM

## 2020-07-13 DIAGNOSIS — R000 Tachycardia, unspecified: Secondary | ICD-10-CM | POA: Diagnosis not present

## 2020-07-13 DIAGNOSIS — R0789 Other chest pain: Secondary | ICD-10-CM | POA: Diagnosis not present

## 2020-07-13 DIAGNOSIS — R072 Precordial pain: Secondary | ICD-10-CM

## 2020-07-13 NOTE — Patient Instructions (Signed)
Medication Instructions:  Your physician recommends that you continue on your current medications as directed. Please refer to the Current Medication list given to you today.  *If you need a refill on your cardiac medications before your next appointment, please call your pharmacy*   Lab Work: Lab work to be done today--CBC, CMET, TSH If you have labs (blood work) drawn today and your tests are completely normal, you will receive your results only by: Marland Kitchen MyChart Message (if you have MyChart) OR . A paper copy in the mail If you have any lab test that is abnormal or we need to change your treatment, we will call you to review the results.   Testing/Procedures: none   Follow-Up: At [facility], you and your health needs are our priority.  As part of our continuing mission to provide you with exceptional heart care, we have created designated Provider Care Teams.  These Care Teams include your primary Cardiologist (physician) and Advanced Practice Providers (APPs -  Physician Assistants and Nurse Practitioners) who all work together to provide you with the care you need, when you need it.  We recommend signing up for the patient portal called "MyChart".  Sign up information is provided on this After Visit Summary.  MyChart is used to connect with patients for Virtual Visits (Telemedicine).  Patients are able to view lab/test results, encounter notes, upcoming appointments, etc.  Non-urgent messages can be sent to your provider as well.   To learn more about what you can do with MyChart, go to [email or web address].    Your next appointment:   As planned  The format for your next appointment:   In Person  Provider:   You may see [name], MD or one of the following Advanced Practice Providers on your designated Care Team:    Kathyrn Drown, NP    Other Instructions

## 2020-07-14 LAB — COMPREHENSIVE METABOLIC PANEL
ALT: 31 IU/L (ref 0–44)
AST: 41 IU/L — ABNORMAL HIGH (ref 0–40)
Albumin/Globulin Ratio: 1.7 (ref 1.2–2.2)
Albumin: 5 g/dL — ABNORMAL HIGH (ref 3.8–4.8)
Alkaline Phosphatase: 96 IU/L (ref 44–121)
BUN/Creatinine Ratio: 11 (ref 10–24)
BUN: 12 mg/dL (ref 8–27)
Bilirubin Total: 0.6 mg/dL (ref 0.0–1.2)
CO2: 18 mmol/L — ABNORMAL LOW (ref 20–29)
Calcium: 10.4 mg/dL — ABNORMAL HIGH (ref 8.6–10.2)
Chloride: 99 mmol/L (ref 96–106)
Creatinine, Ser: 1.09 mg/dL (ref 0.76–1.27)
Globulin, Total: 3 g/dL (ref 1.5–4.5)
Glucose: 93 mg/dL (ref 65–99)
Potassium: 4.6 mmol/L (ref 3.5–5.2)
Sodium: 136 mmol/L (ref 134–144)
Total Protein: 8 g/dL (ref 6.0–8.5)
eGFR: 73 mL/min/{1.73_m2} (ref >59)

## 2020-07-14 LAB — CBC
Hematocrit: 43.8 % (ref 37.5–51.0)
Hemoglobin: 14.7 g/dL (ref 13.0–17.7)
MCH: 30.8 pg (ref 26.6–33.0)
MCHC: 33.6 g/dL (ref 31.5–35.7)
MCV: 92 fL (ref 79–97)
Platelets: 322 10*3/uL (ref 150–450)
RBC: 4.77 x10E6/uL (ref 4.14–5.80)
RDW: 12 % (ref 11.6–15.4)
WBC: 11.2 10*3/uL — ABNORMAL HIGH (ref 3.4–10.8)

## 2020-07-14 LAB — TSH: TSH: 1.31 u[IU]/mL (ref 0.450–4.500)

## 2020-07-16 ENCOUNTER — Telehealth: Payer: Self-pay | Admitting: Interventional Cardiology

## 2020-07-16 NOTE — Telephone Encounter (Signed)
Chad Beasley is calling requesting his lab results. Please advise.

## 2020-07-16 NOTE — Telephone Encounter (Signed)
RN returned call to patient regarding results. All questions asked appropriately. Encouraged patient to contact the office with any questions or concerns.

## 2020-09-30 ENCOUNTER — Encounter: Payer: Self-pay | Admitting: Physician Assistant

## 2020-09-30 NOTE — Progress Notes (Signed)
Cardiology Office Note    Date:  10/01/2020   ID:  Chad Beasley, DOB [birth year], MRN [id number]  PCP:  London Pepper, MD  Cardiologist:  [name], MD  Electrophysiologist:  None   Chief Complaint: f/u chest pain  History of Present Illness:   Chad Beasley is a 70 y.o. 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 He has followed with Dr. Johnsie Cancel over the years with chest pain. Last ischemic eval was via stress echo in 2013 which was normal. Monitor 2015 showed rare PACs, occasional PVCs, range 46-152bpm, mean HR 84bpm, 1 short run of SVT. Last echo 2019 EF 55-60%, mild focal basal septal hypertrophy of the septum, mildly dilated ascending aorta. Calcium score in 2020 was 153, 54%ile, 4cm TAA, aortic atherosclerosis. He recently saw Dr. Irish Lack as DOD with atypical chest pain and HR variation with occasional skips and some orthostatic symptoms. He had mild sinus tachycardia of unclear cause. Labs showed findings below with mild hypercalcemia/elevated albumin and AST, otherwise symptoms felt atypical so no specific further testing was recommended.  He is seen back for follow-up today feeling well. His chest pain and palpitations resolved without specific intervention. In retrospect he does think he was somewhat dehydrated at the initial time of eval. He has rare orthostatic-type dizziness when bending over and standing up quickly 1x/week but he does not see this an issue and has learned to stand up slowly. He did not become hypotensive upon standing today (SBP remained 120s).   Labwork independently reviewed: 06/2020 CMET with Cr 1.09, Ca++ 10.4, albumin 5.0, AST 41, ALT wnl, TSH wnl, WBC 11.2, Hgb 14.7 12/2019 LFTs wnl, LDL 54, Trig 213   Past Medical History:  Diagnosis Date   Ascending  aorta dilation (HCC)    CHEST PAIN-UNSPECIFIED    HYPERTENSION    Premature atrial contractions    Premature ventricular contractions    PSORIASIS     Past Surgical History:  Procedure Laterality Date   APPENDECTOMY  80's   removal of melanoma  2016    Current Medications: Current Meds  Medication Sig   Adalimumab 20 MG/0.4ML KIT Inject 40 mg into the skin See admin instructions. 2 times monthly   atorvastatin (LIPITOR) 20 MG tablet Take 1 tablet (20 mg total) by mouth daily.   clobetasol cream (TEMOVATE) 2.19 % Apply 1 application topically 2 (two) times daily.   losartan (COZAAR) 100 MG tablet TAKE 1 TABLET BY MOUTH EVERY DAY   Multiple Vitamins-Minerals (CENTRUM SILVER 50+MEN) TABS Take 1 tablet by mouth daily.   Omeprazole (PRILOSEC PO) Take 1 tablet by mouth daily.   vitamin C (ASCORBIC ACID) 500 MG tablet Take 1,000 mg by mouth daily.       Allergies:   Patient has no known allergies.   Social History   Socioeconomic History   Marital status: Divorced    Spouse name: Not on file   Number of children: Not on file   Years of education: Not on file   Highest education level: Not on file  Occupational History   Not on file  Tobacco Use   Smoking status: Never   Smokeless tobacco: Never  Vaping Use   Vaping Use: Never used  Substance and Sexual Activity   Alcohol use: No   Drug use: No   Sexual activity: Not on file  Other Topics Concern   Not on file  Social History Narrative   Not on file   Social Determinants of Health   Financial Resource Strain: Not on file  Food Insecurity: Not on file  Transportation Needs: Not on file  Physical Activity: Not on file  Stress: Not on file  Social Connections: Not on file     Family History:  The patient's family history includes Anemia in his father; Cancer in his sister.  ROS:   Please see the history of present illness.  All other systems are reviewed and otherwise negative.    EKGs/Labs/Other Studies  Reviewed:    Studies reviewed are outlined and summarized above. Reports included below if pertinent.  2D Echo 12/2017 - Left ventricle: The cavity size was normal. There was mild focal    basal hypertrophy of the septum. Systolic function was normal.    The estimated ejection fraction was in the range of 55% to 60%.    Wall motion was normal; there were no regional wall motion    abnormalities. Left ventricular diastolic function parameters    were normal.  - Aortic valve: Trileaflet; normal thickness, mildly calcified    leaflets.  - Aorta: Ascending aorta diameter: 38 mm (ED).  - Ascending aorta: The ascending aorta was mildly dilated.  - Mitral valve: Calcified annulus. There was trivial regurgitation.  - Left atrium: The atrium was mildly dilated.  - Atrial septum: There was increased thickness of the septum,    consistent with lipomatous hypertrophy.  - Tricuspid valve: There was trivial regurgitation.  - Pulmonic valve: There was trivial regurgitation.   Cor Calcium Score 2020 EXAM: Coronary Calcium Score   TECHNIQUE: The patient was scanned on a Siemens Somatom 64 slice scanner. Axial non-contrast 3 mm slices were carried out through the heart. The data set was analyzed on a dedicated work station and scored using the [place].   FINDINGS: Non-cardiac: See separate report from [facility].   Ascending aorta: Mildly dilated aortic root to my measurement 3.9 cm   Pericardium: Normal   Coronary arteries: Calcium noted in proximal and mid LAD/Circumflex   IMPRESSION: Coronary calcium score of 153. This was 4 th percentile for age and sex matched control.   [name]     Electronically Signed   By: [name] M.D.   On: 02/05/2019 10:59  Stress echo 2013 Stress ECG conclusions:  The stress ECG was negative for  ischemia.   Impressions:   - Normal study after maximal exercise.        EKG:  EKG is not ordered today  Recent  Labs: 07/13/2020: ALT 31; BUN 12; Creatinine, Ser 1.09; Hemoglobin 14.7; Platelets 322; Potassium 4.6; Sodium 136; TSH 1.310  Recent Lipid Panel    Component Value Date/Time   CHOL 153 01/20/2020 0730   TRIG 213 (H) 01/20/2020 0730   HDL 65 01/20/2020 0730   CHOLHDL 2.4 01/20/2020 0730   LDLCALC 54 01/20/2020 0730    PHYSICAL EXAM:    VS:  BP 120/76   Pulse 79   Ht '5\' 11"'  (1.803 m)   Wt 181 lb 6.4 oz (82.3 kg)   SpO2 99%   BMI 25.30 kg/m   BMI: Body mass index is 25.3 kg/m.  GEN: Well nourished, well developed male in no acute distress HEENT: normocephalic, atraumatic Neck: no JVD, carotid bruits, or masses Cardiac: RRR; no murmurs, rubs, or gallops, no edema  Respiratory:  clear to auscultation bilaterally, normal work  of breathing GI: soft, nontender, nondistended, + BS MS: no deformity or atrophy Skin: warm and dry, no rash Neuro:  Alert and Oriented x 3, Strength and sensation are intact, follows commands Psych: euthymic mood, full affect  Wt Readings from Last 3 Encounters:  10/01/20 181 lb 6.4 oz (82.3 kg)  07/13/20 182 lb (82.6 kg)  09/12/19 184 lb 3.2 oz (83.6 kg)     ASSESSMENT & PLAN:   1. Sinus tachycardia - resolved, suspect this may have been due to under hydration. He will notify for recurrent sx.  2. Hx of PVCs/PACs - quiescent, not currently requiring any therapy for these. Discussed option of rx for patient if he develops symptoms. He has not wished to pursue beta blocker in the past.  3. Aneurysmal ascending thoracic aorta - last assessed 2020 at 4cm. He needs yearly screening. Discussed finding with patient. Will order CT angio of the aorta to assess. BP well controlled. Discussed avoidance of heavy lifting.   4. Coronary calcification on CT - atypical CP resolved. Continue risk factor modification. BP controlled. He is on statin. Check yearly LFTs/lipids in 12/2020. Would continue to aim for goal LDL <70. I will reach out to Dr. Johnsie Cancel to find out  his thoughts on whether the patient should be on a prophylactic baby ASA daily.  5. Essential HTN - controlled. He has rare orthostatic type dizziness standing up quickly but he does not feel this is an issue or interruption to every day life. This is not a new symptom for him. We discussed preemptively decreasing his losartan dose to 63m daily but at this juncture he'd like to continue current regimen since he has been feeling well. He will notify if symptoms increase. Also encouraged to stay well hydrated.  6. Hypercalcemia, leukocytosis, abnormal liver function - noted on labs in 06/2020, will recheck today to ensure they have normalized. Note albumin was also upper end at that time as well.  Disposition: F/u with Dr. NJohnsie Cancelin 1 year.   Medication Adjustments/Labs and Tests Ordered: Current medicines are reviewed at length with the patient today.  Concerns regarding medicines are outlined above. Medication changes, Labs and Tests ordered today are summarized above and listed in the Patient Instructions accessible in Encounters.   Signed, DCharlie Pitter PA-C  10/01/2020 10:12 AM    [address] [address] [place]  [id number]: [phone] Fax: [phone]

## 2020-10-01 ENCOUNTER — Encounter: Payer: Self-pay | Admitting: Physician Assistant

## 2020-10-01 ENCOUNTER — Ambulatory Visit: Payer: PPO | Admitting: Physician Assistant

## 2020-10-01 ENCOUNTER — Other Ambulatory Visit: Payer: Self-pay

## 2020-10-01 VITALS — BP 120/76 | HR 79 | Ht 71.0 in | Wt 181.4 lb

## 2020-10-01 DIAGNOSIS — I491 Atrial premature depolarization: Secondary | ICD-10-CM

## 2020-10-01 DIAGNOSIS — I2510 Atherosclerotic heart disease of native coronary artery without angina pectoris: Secondary | ICD-10-CM

## 2020-10-01 DIAGNOSIS — I10 Essential (primary) hypertension: Secondary | ICD-10-CM

## 2020-10-01 DIAGNOSIS — D72829 Elevated white blood cell count, unspecified: Secondary | ICD-10-CM

## 2020-10-01 DIAGNOSIS — R945 Abnormal results of liver function studies: Secondary | ICD-10-CM

## 2020-10-01 DIAGNOSIS — I493 Ventricular premature depolarization: Secondary | ICD-10-CM

## 2020-10-01 DIAGNOSIS — R000 Tachycardia, unspecified: Secondary | ICD-10-CM | POA: Diagnosis not present

## 2020-10-01 DIAGNOSIS — I77810 Thoracic aortic ectasia: Secondary | ICD-10-CM

## 2020-10-01 LAB — COMPREHENSIVE METABOLIC PANEL
ALT: 21 IU/L (ref 0–44)
AST: 34 IU/L (ref 0–40)
Albumin/Globulin Ratio: 1.6 (ref 1.2–2.2)
Albumin: 4.5 g/dL (ref 3.8–4.8)
Alkaline Phosphatase: 95 IU/L (ref 44–121)
BUN/Creatinine Ratio: 10 (ref 10–24)
BUN: 9 mg/dL (ref 8–27)
Bilirubin Total: 0.4 mg/dL (ref 0.0–1.2)
CO2: 21 mmol/L (ref 20–29)
Calcium: 9.8 mg/dL (ref 8.6–10.2)
Chloride: 101 mmol/L (ref 96–106)
Creatinine, Ser: 0.92 mg/dL (ref 0.76–1.27)
Globulin, Total: 2.8 g/dL (ref 1.5–4.5)
Glucose: 77 mg/dL (ref 65–99)
Potassium: 4.4 mmol/L (ref 3.5–5.2)
Sodium: 138 mmol/L (ref 134–144)
Total Protein: 7.3 g/dL (ref 6.0–8.5)
eGFR: 89 mL/min/{1.73_m2} (ref >59)

## 2020-10-01 LAB — CBC
Hematocrit: 40.6 % (ref 37.5–51.0)
Hemoglobin: 13.5 g/dL (ref 13.0–17.7)
MCH: 30.6 pg (ref 26.6–33.0)
MCHC: 33.3 g/dL (ref 31.5–35.7)
MCV: 92 fL (ref 79–97)
Platelets: 266 10*3/uL (ref 150–450)
RBC: 4.41 x10E6/uL (ref 4.14–5.80)
RDW: 11.9 % (ref 11.6–15.4)
WBC: 6.8 10*3/uL (ref 3.4–10.8)

## 2020-10-01 NOTE — Patient Instructions (Signed)
Medication Instructions:  Your physician recommends that you continue on your current medications as directed. Please refer to the Current Medication list given to you today.  *If you need a refill on your cardiac medications before your next appointment, please call your pharmacy*   Lab Work: [place] physician recommends that you return for a FASTING lipid profile and hepatic function test in November   If you have labs (blood work) drawn today and your tests are completely normal, you will receive your results only by: [place] (if you have MyChart) OR A paper copy in the mail If you have any lab test that is abnormal or we need to change your treatment, we will call you to review the results.   Testing/Procedures: Your physician has requested that you have a CTA of your Aorta done.    Follow-Up: At [facility], you and your health needs are our priority.  As part of our continuing mission to provide you with exceptional heart care, we have created designated Provider Care Teams.  These Care Teams include your primary Cardiologist (physician) and Advanced Practice Providers (APPs -  Physician Assistants and Nurse Practitioners) who all work together to provide you with the care you need, when you need it.  We recommend signing up for the patient portal called "MyChart".  Sign up information is provided on this After Visit Summary.  MyChart is used to connect with patients for Virtual Visits (Telemedicine).  Patients are able to view lab/test results, encounter notes, upcoming appointments, etc.  Non-urgent messages can be sent to your provider as well.   To learn more about what you can do with MyChart, go to [email or web address].    Your next appointment:   12 month(s)  The format for your next appointment:   In Person  Provider:   You may see [name], MD or one of the following Advanced Practice Providers on your designated Care Team:   Cecilie Kicks, NP   Other Instructions None

## 2020-10-04 ENCOUNTER — Telehealth: Payer: Self-pay | Admitting: *Deleted

## 2020-10-04 NOTE — Telephone Encounter (Signed)
Call placed to pt, left a message for him to call back.

## 2020-10-04 NOTE — Telephone Encounter (Signed)
-----   Message from Imogene Burn, PA-C sent at 10/04/2020 10:42 AM EDT ----- Can you let patient know that Dr. Johnsie Cancel recommends ASA 81 mg daily. Thanks. ----- Message ----- From: Josue Hector, MD Sent: 10/04/2020   9:55 AM EDT To: Charlie Pitter, PA-C  81 mg ASA fine  ----- Message ----- From: Charlie Pitter, PA-C Sent: 10/01/2020  10:26 AM EDT To: Josue Hector, MD, Charlie Pitter, PA-C  [facility], I wanted to get your thoughts on empiric ASA '81mg'$  daily for this patient with elevated calcium score and aortic atherosclerosis. 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 Calcium score in 2020 was 153, 54%ile, 4cm TAA, aortic atherosclerosis. He is doing well. CT is pending to evaluate his aortic size. I am wondering how you approach ASA in this type of patient. Thanks in advance.

## 2020-10-06 NOTE — Telephone Encounter (Signed)
2nd attempt to reach pt, left another message for him to call back.

## 2020-10-07 ENCOUNTER — Ambulatory Visit (INDEPENDENT_AMBULATORY_CARE_PROVIDER_SITE_OTHER): Payer: PPO

## 2020-10-07 ENCOUNTER — Telehealth: Payer: Self-pay

## 2020-10-07 DIAGNOSIS — I491 Atrial premature depolarization: Secondary | ICD-10-CM

## 2020-10-07 DIAGNOSIS — I493 Ventricular premature depolarization: Secondary | ICD-10-CM

## 2020-10-07 MED ORDER — ASPIRIN EC 81 MG PO TBEC: 81.0000 mg | DELAYED_RELEASE_TABLET | Freq: Every day | ORAL | 11 refills | Status: AC

## 2020-10-07 NOTE — Progress Notes (Signed)
Pt has been made aware of normal result and verbalized understanding.  jw

## 2020-10-07 NOTE — Telephone Encounter (Signed)
Call transferred to this nurse.  Pt states he continues to have an irregular heart beat- heart rate ranging from 47-105.  He is concerned what this is.  His last HM in 2015 demonstrated PAC's, PVC's, and NSVT.  Pt has not had monitor repeated since then.  Will discuss with Dr. Johnsie Cancel if he thinks a 2 week monitor would be appropriate at this time to reassess.

## 2020-10-07 NOTE — Telephone Encounter (Signed)
Pt returned my call and has been made aware of his lab results.  Pt also advised to start Aspirin 81 mg daily.   He began to tell me about a episode he had on Saturday with light headedness and dizziness, and abnormal heart rate.  Stating going up to 105 and dropping down to 45.  Pt was transferred to Triage to discuss / triage.

## 2020-10-07 NOTE — Telephone Encounter (Signed)
Per Dr. Toy Baker to order 2 week ZIO.  Pt notified.  He will come to office today to have monitor placed.

## 2020-10-15 ENCOUNTER — Other Ambulatory Visit: Payer: Self-pay | Admitting: Cardiovascular Disease

## 2020-10-15 DIAGNOSIS — I10 Essential (primary) hypertension: Secondary | ICD-10-CM

## 2020-10-26 ENCOUNTER — Telehealth: Payer: Self-pay | Admitting: Physician Assistant

## 2020-10-26 DIAGNOSIS — Z85820 Personal history of malignant melanoma of skin: Secondary | ICD-10-CM | POA: Diagnosis not present

## 2020-10-26 DIAGNOSIS — L400 Psoriasis vulgaris: Secondary | ICD-10-CM | POA: Diagnosis not present

## 2020-10-26 DIAGNOSIS — L299 Pruritus, unspecified: Secondary | ICD-10-CM | POA: Diagnosis not present

## 2020-10-26 DIAGNOSIS — D225 Melanocytic nevi of trunk: Secondary | ICD-10-CM | POA: Diagnosis not present

## 2020-10-26 NOTE — Telephone Encounter (Signed)
Opened in error, meant to open as documentation note. Spoke with Dr. Johnsie Cancel about starting baby ASA for prevention purposes given h/o coronary calcium, aortic atherosclerosis. He feels this is reasonable as long as no issues with bleeding or falling. We will await his CT angio of his chest at which time we can let him know about the ASA.

## 2020-10-29 ENCOUNTER — Telehealth: Payer: Self-pay | Admitting: Physician Assistant

## 2020-10-29 ENCOUNTER — Other Ambulatory Visit: Payer: Self-pay

## 2020-10-29 ENCOUNTER — Ambulatory Visit (INDEPENDENT_AMBULATORY_CARE_PROVIDER_SITE_OTHER)
Admission: RE | Admit: 2020-10-29 | Discharge: 2020-10-29 | Disposition: A | Discharge status: Home / Self Care | Payer: PPO | Source: Ambulatory Visit | Attending: Physician Assistant | Admitting: Physician Assistant

## 2020-10-29 DIAGNOSIS — I493 Ventricular premature depolarization: Secondary | ICD-10-CM | POA: Diagnosis not present

## 2020-10-29 DIAGNOSIS — I77810 Thoracic aortic ectasia: Secondary | ICD-10-CM

## 2020-10-29 DIAGNOSIS — I712 Thoracic aortic aneurysm, without rupture: Secondary | ICD-10-CM | POA: Diagnosis not present

## 2020-10-29 DIAGNOSIS — I491 Atrial premature depolarization: Secondary | ICD-10-CM | POA: Diagnosis not present

## 2020-10-29 MED ORDER — IOHEXOL 350 MG/ML SOLN
100.0000 mL | Freq: Once | INTRAVENOUS | Status: AC | PRN
  Administered 2020-10-29: 100 mL via INTRAVENOUS

## 2020-10-29 NOTE — Telephone Encounter (Signed)
Returned the call back to the pt and he has been made aware of his CT Angio results. See result note.

## 2020-10-29 NOTE — Telephone Encounter (Signed)
    Pt is returning call to get CT result 

## 2020-10-29 NOTE — Telephone Encounter (Signed)
-----   Message from Charlie Pitter, [place] sent at 10/29/2020  2:32 PM EDT ----- Please let patient know CT angio was stable, only mild dilation of the aorta, unchanged from 2020. It does show the coronary artery and aorta calcium we know about. I had discussed his case with Dr. Johnsie Cancel this week and per our discussion, if no history of abnormal bleeding, let's go ahead and start a baby aspirin '81mg'$  daily. If you notice any bleeding such as blood in stool, black tarry stools, blood in urine, nosebleeds or any other unusual bleeding, call your doctor immediately. It is not normal to have this kind of bleeding while on a blood thinner and usually indicates there is an underlying problem with one of your body systems that needs to be checked out. Thanks!

## 2020-11-01 ENCOUNTER — Telehealth: Payer: Self-pay

## 2020-11-01 DIAGNOSIS — R002 Palpitations: Secondary | ICD-10-CM

## 2020-11-01 DIAGNOSIS — I493 Ventricular premature depolarization: Secondary | ICD-10-CM

## 2020-11-01 DIAGNOSIS — R072 Precordial pain: Secondary | ICD-10-CM

## 2020-11-01 MED ORDER — METOPROLOL SUCCINATE ER 25 MG PO TB24: 25.0000 mg | ORAL_TABLET | Freq: Every day | ORAL | 3 refills | Status: DC

## 2020-11-01 NOTE — Telephone Encounter (Signed)
Called patient with results. Placed orders and updated medication list. Will send instructions for myoview to patient's My Chart.   Patient did not want to see another doctor until he sees Dr. Johnsie Cancel. Made patient an appointment with Dr. Johnsie Cancel potentially before his stress test to discuss monitor results. Placed order for EP referral.

## 2020-11-01 NOTE — Telephone Encounter (Signed)
-----   Message from Josue Hector, MD sent at 10/29/2020  5:58 PM EDT ----- 15% beats PVS;s with some NSVT EF has been normal Start Toprol 25 mg daily refer to EP and get Lexiscan myovue r/o ischemia. Has had high calcium score in past and normal EF

## 2020-11-04 ENCOUNTER — Ambulatory Visit (HOSPITAL_BASED_OUTPATIENT_CLINIC_OR_DEPARTMENT_OTHER): Payer: PPO | Admitting: Internal Medicine

## 2020-11-04 ENCOUNTER — Other Ambulatory Visit: Payer: Self-pay

## 2020-11-04 VITALS — BP 136/78 | HR 74 | Ht 71.0 in | Wt 179.0 lb

## 2020-11-04 DIAGNOSIS — I493 Ventricular premature depolarization: Secondary | ICD-10-CM

## 2020-11-04 DIAGNOSIS — I472 Ventricular tachycardia: Secondary | ICD-10-CM

## 2020-11-04 DIAGNOSIS — I10 Essential (primary) hypertension: Secondary | ICD-10-CM

## 2020-11-04 DIAGNOSIS — I4729 Other ventricular tachycardia: Secondary | ICD-10-CM

## 2020-11-04 NOTE — Patient Instructions (Addendum)
Medication Instructions:  Your physician recommends that you continue on your current medications as directed. Please refer to the Current Medication list given to you today.  Labwork: None ordered.  Testing/Procedures: Your physician has requested that you have an echocardiogram. Echocardiography is a painless test that uses sound waves to create images of your heart. It provides your doctor with information about the size and shape of your heart and how well your heart's chambers and valves are working. This procedure takes approximately one hour. There are no restrictions for this procedure.   Follow-Up: Your physician wants you to follow-up in: 6 weeks with  one of the following Advanced Practice Providers on your designated Care Team:     Legrand Como "Jonni Sanger" Chalmers Cater, [place]    Any Other Special Instructions Will Be Listed Below (If Applicable).  If you need a refill on your cardiac medications before your next appointment, please call your pharmacy.

## 2020-11-04 NOTE — Progress Notes (Signed)
Electrophysiology Office Note   Date:  11/04/2020   ID:  Chad Beasley, DOB 1950-07-09, MRN [id number]  PCP:  Chad Pepper, MD  Cardiologist:  Dr Chad Beasley Primary Electrophysiologist: Chad Grayer, MD    CC: palpitations   History of Present Illness: Chad Beasley is a 70 y.o. male who presents today for electrophysiology evaluation.   He is referred by Dr Chad Beasley for EP consultation regarding palpitations.  The patient reports recently developing tachypalpitations.  These are most noticeable when exerting.  He reports mild presyncope with episodes as well as subsequent fatigue. He has occasional associated SOB and chest discomfort.   He contacted Dr Chad Beasley office and had [facility] ordered.  This remains pending.   Today, he denies symptoms of  orthopnea, PND, lower extremity edema, claudication, dizziness, presyncope, syncope, bleeding, or neurologic sequela. The patient is tolerating medications without difficulties and is otherwise without complaint today.    Past Medical History:  Diagnosis Date   Ascending aorta dilation (HCC)    CHEST PAIN-UNSPECIFIED    HYPERTENSION    Premature atrial contractions    Premature ventricular contractions    PSORIASIS    Past Surgical History:  Procedure Laterality Date   APPENDECTOMY  80's   removal of melanoma  2016     Current Outpatient Medications  Medication Sig Dispense Refill   Adalimumab 20 MG/0.4ML KIT Inject 40 mg into the skin See admin instructions. 2 times monthly     aspirin EC 81 MG tablet Take 1 tablet (81 mg total) by mouth daily. Swallow whole. 30 tablet 11   atorvastatin (LIPITOR) 20 MG tablet TAKE 1 TABLET BY MOUTH EVERY DAY 90 tablet 3   clobetasol cream (TEMOVATE) 4.12 % Apply 1 application topically 2 (two) times daily.     losartan (COZAAR) 100 MG tablet TAKE 1 TABLET BY MOUTH EVERY DAY 90 tablet 2   metoprolol succinate (TOPROL XL) 25 MG 24 hr tablet Take 1 tablet (25 mg total) by mouth daily. 90 tablet 3    Multiple Vitamins-Minerals (CENTRUM SILVER 50+MEN) TABS Take 1 tablet by mouth daily.     Omeprazole (PRILOSEC PO) Take 1 tablet by mouth daily.     vitamin C (ASCORBIC ACID) 500 MG tablet Take 1,000 mg by mouth daily.      No current facility-administered medications for this visit.    Allergies:   Patient has no known allergies.   Social History:  The patient  reports that he has never smoked. He has never used smokeless tobacco. He reports that he does not drink alcohol and does not use drugs.   Family History:  The patient's family history includes Anemia in his father; Cancer in his sister.   Denies FH of arrhythmias, sudden death or syncope   ROS:  Please see the history of present illness.   All other systems are personally reviewed and negative.    PHYSICAL EXAM: VS:  BP 136/78   Pulse 74   Ht _0  (1.803 m)   Wt 179 lb (81.2 kg)   SpO2 97%   BMI 24.97 kg/m  , BMI Body mass index is 24.97 kg/m. GEN: Well nourished, well developed, in no acute distress HEENT: normal Neck: no JVD, carotid bruits, or masses Cardiac: RR; no murmurs, rubs, or gallops,no edema  Respiratory:  clear to auscultation bilaterally, normal work of breathing GI: soft, nontender, nondistended, + BS MS: no deformity or atrophy Skin: warm and dry  Neuro:  Strength and sensation are intact  Psych: euthymic mood, full affect  EKG:  EKG is ordered today. The ekg ordered today is personally reviewed and shows sinus rhythm 74 bpm, PR 188 msec, QRS 84 msec, QTc 406 msec  Echo 2019 reviewed   Recent Labs: 07/13/2020: TSH 1.310 10/01/2020: ALT 21; BUN 9; Creatinine, Ser 0.92; Hemoglobin 13.5; Platelets 266; Potassium 4.4; Sodium 138  personally reviewed   Lipid Panel     Component Value Date/Time   CHOL 153 01/20/2020 0730   TRIG 213 (H) 01/20/2020 0730   HDL 65 01/20/2020 0730   CHOLHDL 2.4 01/20/2020 0730   LDLCALC 54 01/20/2020 0730   personally reviewed   Wt Readings from Last 3  Encounters:  11/04/20 179 lb (81.2 kg)  10/01/20 181 lb 6.4 oz (82.3 kg)  07/13/20 182 lb (82.6 kg)      Other studies personally reviewed: Additional studies/ records that were reviewed today include: Dr Chad Beasley notes,   Chad Beasley notes, recent phone notes, prior echo, event monitor Review of the above records today demonstrates: as above   ASSESSMENT AND PLAN:  1.  PVCs, NSVT Unclear etiology I am concerns about ischemia as the cause He has a stress test pending.  He has not started metoprolol as prescribed.  I have advised that he start taking this immediately. I have also ordered an echo.   I have advised that he  not exercise until after his myoview is resulted.   I would have a low threshold to proceed to cath.  If ischemic workup and echo are unrevealing, would continue medical therapy with beta blockers going forward.  I discussed at length with patient and his wife today.  2. HTN Stable No change required today  3. HL Continue atrovastatin   Follow-up:  return to see EP APP after ischemic workup and echo are resulted     Signed, Chad Grayer, MD  11/04/2020 5:08 PM     [facility] [address] [place] [address] [phone] (office) [phone] (fax)

## 2020-11-09 ENCOUNTER — Encounter (HOSPITAL_COMMUNITY): Payer: PPO

## 2020-11-11 ENCOUNTER — Telehealth (HOSPITAL_COMMUNITY): Payer: Self-pay | Admitting: *Deleted

## 2020-11-11 NOTE — Telephone Encounter (Signed)
Patient given detailed instructions per Myocardial Perfusion Study Information Sheet for the test on 11/17/20 Patient notified to arrive 15 minutes early and that it is imperative to arrive on time for appointment to keep from having the test rescheduled.  If you need to cancel or reschedule your appointment, please call the office within 24 hours of your appointment. . Patient verbalized understanding. Chad Beasley

## 2020-11-12 ENCOUNTER — Other Ambulatory Visit: Payer: Self-pay

## 2020-11-12 ENCOUNTER — Ambulatory Visit (INDEPENDENT_AMBULATORY_CARE_PROVIDER_SITE_OTHER): Payer: PPO

## 2020-11-12 DIAGNOSIS — I472 Ventricular tachycardia: Secondary | ICD-10-CM

## 2020-11-12 DIAGNOSIS — I4729 Other ventricular tachycardia: Secondary | ICD-10-CM

## 2020-11-12 LAB — ECHOCARDIOGRAM COMPLETE
Area-P 1/2: 3.31 cm2
MV M vel: 2.33 m/s
MV Peak grad: 21.7 mmHg
S' Lateral: 3.39 cm

## 2020-11-17 ENCOUNTER — Ambulatory Visit (HOSPITAL_COMMUNITY): Payer: PPO | Attending: Cardiovascular Disease

## 2020-11-17 ENCOUNTER — Other Ambulatory Visit: Payer: Self-pay

## 2020-11-17 DIAGNOSIS — R072 Precordial pain: Secondary | ICD-10-CM | POA: Insufficient documentation

## 2020-11-17 DIAGNOSIS — I493 Ventricular premature depolarization: Secondary | ICD-10-CM | POA: Insufficient documentation

## 2020-11-17 DIAGNOSIS — R002 Palpitations: Secondary | ICD-10-CM | POA: Diagnosis not present

## 2020-11-17 LAB — MYOCARDIAL PERFUSION IMAGING
LV dias vol: 45 mL (ref 62–150)
LV sys vol: 101 mL
Nuc Stress EF: 55 %
Peak HR: 93 {beats}/min
Rest HR: 46 {beats}/min
Rest Nuclear Isotope Dose: 10.5 mCi
SDS: 2
SRS: 0
SSS: 2
ST Depression (mm): 0 mm
Stress Nuclear Isotope Dose: 31.1 mCi
TID: 1.03

## 2020-11-17 MED ORDER — REGADENOSON 0.4 MG/5ML IV SOLN
0.4000 mg | Freq: Once | INTRAVENOUS | Status: AC
  Administered 2020-11-17: 0.4 mg via INTRAVENOUS

## 2020-11-17 MED ORDER — TECHNETIUM TC 99M TETROFOSMIN IV KIT
31.1000 | PACK | Freq: Once | INTRAVENOUS | Status: AC | PRN
  Administered 2020-11-17: 31.1 via INTRAVENOUS
  Filled 2020-11-17: qty 32

## 2020-11-17 MED ORDER — TECHNETIUM TC 99M TETROFOSMIN IV KIT
10.5000 | PACK | Freq: Once | INTRAVENOUS | Status: AC | PRN
  Administered 2020-11-17: 10.5 via INTRAVENOUS
  Filled 2020-11-17: qty 11

## 2020-11-17 NOTE — Progress Notes (Signed)
Cardiology Office Note    Date:  11/22/2020   ID:  Chad Beasley, Chad Beasley 02-Mar-1950, MRN [id number]  PCP:  [place] Pepper, MD  Cardiologist:  [name], MD  Electrophysiologist:  None   Chief Complaint: f/u chest pain  History of Present Illness:   Chad Beasley is a 70 y.o. 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  Calcium score in 2020 was 153, 54%ile, 4cm TAA, aortic atherosclerosis. He recently saw Dr. Irish Lack as DOD with atypical chest pain and HR variation with occasional skips and some orthostatic symptoms. He had mild sinus tachycardia of unclear cause. Labs showed findings below with mild hypercalcemia/elevated albumin and AST, otherwise symptoms felt atypical so no specific further testing was recommended.  F/U myovue done 11/17/20 normal EF 55% TTE EF 50-55% mild MR and AV sclerosis  Monitor 10/29/20 with 15% PVCls started on beta blocker  Seen by Dr Rayann Heman 11/04/20 and hadn't taken beta blocker recommended Continued medical Rx if myovue not ischemic  He still feels poorly with palpitations, fatigue , dyspnea and pre syncope when he bends over No chest pain He has only been on Toprol since 11/01/20  Discussed repeating 48 hr holter in 4 weeks to see if PVC burden lower. Suspect he will need AAT/ablation. Also discussed low threshhold to do cath or cardiac MRI if still symptomatic   His symptoms of malaise, dyspnea and pre syncope seem disproportionate to just having PVCls   Labwork independently reviewed: 06/2020 CMET with Cr 1.09, Ca++ 10.4, albumin 5.0, AST 41, ALT wnl, TSH wnl, WBC 11.2, Hgb 14.7 12/2019 LFTs wnl, LDL 54, Trig 213   Past Medical History:  Diagnosis Date   Ascending aorta dilation ([place])    CHEST PAIN-UNSPECIFIED    HYPERTENSION    Premature atrial  contractions    Premature ventricular contractions    PSORIASIS     Past Surgical History:  Procedure Laterality Date   APPENDECTOMY  80's   removal of melanoma  2016    Current Medications: Current Meds  Medication Sig   Adalimumab 20 MG/0.4ML KIT Inject 40 mg into the skin See admin instructions. 2 times monthly   aspirin EC 81 MG tablet Take 1 tablet (81 mg total) by mouth daily. Swallow whole.   atorvastatin (LIPITOR) 20 MG tablet TAKE 1 TABLET BY MOUTH EVERY DAY   clobetasol cream (TEMOVATE) 4.58 % Apply 1 application topically 2 (two) times daily.   losartan (COZAAR) 100 MG tablet TAKE 1 TABLET BY MOUTH EVERY DAY   metoprolol succinate (TOPROL XL) 25 MG 24 hr tablet Take 1 tablet (25 mg total) by mouth daily.   Multiple Vitamins-Minerals (CENTRUM SILVER 50+MEN) TABS Take 1 tablet by mouth daily.   Omeprazole (PRILOSEC PO) Take 1 tablet by mouth daily.   vitamin C (ASCORBIC ACID) 500 MG tablet Take 1,000 mg by mouth daily.       Allergies:   Patient has no known allergies.   Social History   Socioeconomic History   Marital status: Divorced    Spouse name: Not on file   Number of children: Not on file   Years of education: Not on file   Highest education level: Not on file  Occupational History   Not on file  Tobacco Use   Smoking status: Never   Smokeless tobacco: Never  Vaping Use  Vaping Use: Never used  Substance and Sexual Activity   Alcohol use: No   Drug use: No   Sexual activity: Not on file  Other Topics Concern   Not on file  Social History Narrative   Not on file   Social Determinants of Health   Financial Resource Strain: Not on file  Food Insecurity: Not on file  Transportation Needs: Not on file  Physical Activity: Not on file  Stress: Not on file  Social Connections: Not on file     Family History:  The patient's family history includes Anemia in his father; Cancer in his sister.  ROS:   Please see the history of present illness.   All other systems are reviewed and otherwise negative.    EKGs/Labs/Other Studies Reviewed:    Studies reviewed are outlined and summarized above. Reports included below if pertinent.  Myovue:  11/17/20  Study Highlights      The study is normal. The study is low risk.   No ST deviation was noted.   LV perfusion is normal. There is no evidence of ischemia. There is no evidence of infarction.   Left ventricular function is normal. Nuclear stress EF: 55 %. The left ventricular ejection fraction is normal (55-65%). End diastolic cavity size is normal.   Prior study available for comparison from 04/01/2004.   Reduced counts in the inferior segments with improvement on stress imaging and normal wall motion. This is consistent with diaphragm attenuation.  This is a normal study without evidence of ischemia or infarction.  Normal LVEF, 55%. This is a low risk study.    TTE 11/12/20  IMPRESSIONS     1. Global longitudinal strain is -14.3%      Inferolateral and distal anteroseptal/anterolateral hypokinesis. .  Left ventricular ejection fraction, by estimation, is 50 to 55%. The left  ventricle has low normal function. The left ventricle has no regional wall  motion abnormalities. Left  ventricular diastolic parameters were normal.   2. Right ventricular systolic function is normal. The right ventricular  size is normal.   3. The mitral valve is normal in structure. Mild mitral valve  regurgitation.   4. The aortic valve is tricuspid. Aortic valve regurgitation is not  visualized. Mild to moderate aortic valve sclerosis/calcification is  present, without any evidence of aortic stenosis.   5. The inferior vena cava is normal in size with greater than 50%  respiratory variability, suggesting right atrial pressure of 3 mmHg.   EKG:  EKG is not ordered today  Recent Labs: 07/13/2020: TSH 1.310 10/01/2020: ALT 21; BUN 9; Creatinine, Ser 0.92; Hemoglobin 13.5; Platelets 266; Potassium  4.4; Sodium 138  Recent Lipid Panel    Component Value Date/Time   CHOL 153 01/20/2020 0730   TRIG 213 (H) 01/20/2020 0730   HDL 65 01/20/2020 0730   CHOLHDL 2.4 01/20/2020 0730   LDLCALC 54 01/20/2020 0730    PHYSICAL EXAM:    VS:  BP 118/76 (BP Location: Left Arm, Patient Position: Sitting, Cuff Size: Large)   Pulse 74   Ht 5' 11" (1.803 m)   Wt 81.8 kg   SpO2 99%   BMI 25.16 kg/m   BMI: Body mass index is 25.16 kg/m.  Affect appropriate Healthy:  appears stated age 71: normal Neck supple with no adenopathy JVP normal no bruits no thyromegaly Lungs clear with no wheezing and good diaphragmatic motion Heart:  S1/S2 no murmur, no rub, gallop or click PMI normal Abdomen: benighn, BS positve,  no tenderness, no AAA no bruit.  No HSM or HJR Distal pulses intact with no bruits No edema Neuro non-focal Skin warm and dry No muscular weakness   Wt Readings from Last 3 Encounters:  11/22/20 81.8 kg  11/04/20 81.2 kg  10/01/20 82.3 kg     ASSESSMENT & PLAN:   1. Sinus tachycardia - resolved, suspect this may have been due to under hydration. He will notify for recurrent sx. Now on beta blocker   2. PVC: high burden in setting of normal LV function and non ischemic myovue Seen by Dr Rayann Heman continue beta blocker will repeat 48 hr holter in 4 weeks and have him f/u with Dr Quentin Ore to discuss AAT vs ablation if burden still high. Dr Rayann Heman is leaving practice   3. Aneurysmal ascending thoracic aorta - CTA 10/29/20 only ectasia 3.9 cm stable   4. Essential HTN - controlled.    5. CAD:  calcium score 02/05/19 153 in LAD/Circumflex 54 th percentile continue ASA/Statin  Non ischemic myovue  11/17/20   Disposition: F/u  Dr Quentin Ore 6 weeks and me after that    Medication Adjustments/Labs and Tests Ordered: Current medicines are reviewed at length with the patient today.  Concerns regarding medicines are outlined above. Medication changes, Labs and Tests ordered today are  summarized above and listed in the Patient Instructions accessible in Encounters.   Signed, [name], MD  11/22/2020 10:25 AM    [place], [place], [place]  [address] Phone: [phone]; Fax: [phone]

## 2020-11-18 ENCOUNTER — Telehealth: Payer: Self-pay | Admitting: Cardiovascular Disease

## 2020-11-18 NOTE — Telephone Encounter (Signed)
Patient returning call for stress test results. 

## 2020-11-22 ENCOUNTER — Other Ambulatory Visit: Payer: Self-pay

## 2020-11-22 ENCOUNTER — Ambulatory Visit: Payer: PPO | Admitting: Cardiovascular Disease

## 2020-11-22 ENCOUNTER — Encounter: Payer: Self-pay | Admitting: Cardiovascular Disease

## 2020-11-22 ENCOUNTER — Ambulatory Visit (INDEPENDENT_AMBULATORY_CARE_PROVIDER_SITE_OTHER): Payer: PPO

## 2020-11-22 VITALS — BP 118/76 | HR 74 | Ht 71.0 in | Wt 180.4 lb

## 2020-11-22 DIAGNOSIS — I2510 Atherosclerotic heart disease of native coronary artery without angina pectoris: Secondary | ICD-10-CM | POA: Diagnosis not present

## 2020-11-22 DIAGNOSIS — R002 Palpitations: Secondary | ICD-10-CM | POA: Diagnosis not present

## 2020-11-22 DIAGNOSIS — I493 Ventricular premature depolarization: Secondary | ICD-10-CM | POA: Diagnosis not present

## 2020-11-22 DIAGNOSIS — I10 Essential (primary) hypertension: Secondary | ICD-10-CM

## 2020-11-22 DIAGNOSIS — I4729 Other ventricular tachycardia: Secondary | ICD-10-CM

## 2020-11-22 DIAGNOSIS — E785 Hyperlipidemia, unspecified: Secondary | ICD-10-CM | POA: Diagnosis not present

## 2020-11-22 NOTE — Telephone Encounter (Signed)
See result note.  

## 2020-11-22 NOTE — Patient Instructions (Addendum)
Medication Instructions:  *If you need a refill on your cardiac medications before your next appointment, please call your pharmacy*  Lab Work: If you have labs (blood work) drawn today and your tests are completely normal, you will receive your results only by: [place] (if you have MyChart) OR A paper copy in the mail If you have any lab test that is abnormal or we need to change your treatment, we will call you to review the results.  Testing/Procedures: [place]- Long Term Monitor Instructions  Your physician has requested you wear a ZIO patch monitor for 14 days.  This is a single patch monitor. [employer] supplies one patch monitor per enrollment. Additional stickers are not available. Please do not apply patch if you will be having a Nuclear Stress Test,  Echocardiogram, Cardiac CT, MRI, or Chest Xray during the period you would be wearing the  monitor. The patch cannot be worn during these tests. You cannot remove and re-apply the  ZIO XT patch monitor.  Your ZIO patch monitor will be mailed 3 day USPS to your address on file. It may take 3-5 days  to receive your monitor after you have been enrolled.  Once you have received your monitor, please review the enclosed instructions. Your monitor  has already been registered assigning a specific monitor serial # to you.  Billing and Patient Assistance Program Information  We have supplied [employer] with any of your insurance information on file for billing purposes. [employer] offers a sliding scale Patient Assistance Program for patients that do not have  insurance, or whose insurance does not completely cover the cost of the ZIO monitor.  You must apply for the Patient Assistance Program to qualify for this discounted rate.  To apply, please call [employer] at [phone], select option 4, select option 2, ask to apply for  Patient Assistance Program. Theodore Demark will ask your household income, and how many people  are in your household.  They will quote your out-of-pocket cost based on that information.  [employer] will also be able to set up a 37-month, interest-free payment plan if needed.  Applying the monitor   Shave hair from upper left chest.  Hold abrader disc by orange tab. Rub abrader in 40 strokes over the upper left chest as  indicated in your monitor instructions.  Clean area with 4 enclosed alcohol pads. Let dry.  Apply patch as indicated in monitor instructions. Patch will be placed under collarbone on left  side of chest with arrow pointing upward.  Rub patch adhesive wings for 2 minutes. Remove white label marked "1". Remove the white  label marked "2". Rub patch adhesive wings for 2 additional minutes.  While looking in a mirror, press and release button in center of patch. A small green light will  flash 3-4 times. This will be your only indicator that the monitor has been turned on.  Do not shower for the first 24 hours. You may shower after the first 24 hours.  Press the button if you feel a symptom. You will hear a small click. Record Date, Time and  Symptom in the Patient Logbook.  When you are ready to remove the patch, follow instructions on the last 2 pages of Patient  Logbook. Stick patch monitor onto the last page of Patient Logbook.  Place Patient Logbook in the blue and white box. Use locking tab on box and tape box closed  securely. The blue and white box has prepaid postage on it. Please  place it in the mailbox as  soon as possible. Your physician should have your test results approximately 7 days after the  monitor has been mailed back to [facility].  Call [place] at [phone] if you have questions regarding  your ZIO XT patch monitor. Call them immediately if you see an orange light blinking on your  monitor.  If your monitor falls off in less than 4 days, contact our Monitor department at [phone].  If your monitor becomes loose or falls off after 4 days call  [employer] at [phone] for  suggestions on securing your monitor   Follow-Up: At [facility], you and your health needs are our priority.  As part of our continuing mission to provide you with exceptional heart care, we have created designated Provider Care Teams.  These Care Teams include your primary Cardiologist (physician) and Advanced Practice Providers (APPs -  Physician Assistants and Nurse Practitioners) who all work together to provide you with the care you need, when you need it.  We recommend signing up for the patient portal called "MyChart".  Sign up information is provided on this After Visit Summary.  MyChart is used to connect with patients for Virtual Visits (Telemedicine).  Patients are able to view lab/test results, encounter notes, upcoming appointments, etc.  Non-urgent messages can be sent to your provider as well.   To learn more about what you can do with MyChart, go to [email or web address].    Your next appointment:   4 months  The format for your next appointment:   In Person  Provider:   You may see [name], MD or one of the following Advanced Practice Providers on your designated Care Team:   Cecilie Kicks, NP   You have been referred to Dr. Quentin Ore.

## 2020-11-22 NOTE — Progress Notes (Unsigned)
Patient enrolled for [employer] to mail a 3 day ZIO XT monitor to his address for a 72/10/209 application.

## 2020-11-23 ENCOUNTER — Telehealth: Payer: Self-pay | Admitting: Cardiovascular Disease

## 2020-11-23 NOTE — Telephone Encounter (Signed)
Patient's wife is calling back with questions/concerns with yesterday's dr appt. Please advise

## 2020-11-23 NOTE — Telephone Encounter (Signed)
Chad Beasley  has lots of question about the heart monitor that was ordered. Advised someone in our monitor department would give her a call back.

## 2020-11-23 NOTE — Telephone Encounter (Signed)
Answered all questions regarding 2-3 day ZIO XT long term holter monitor to be applied approximately 12/23/20.   Telephone number for [employer] [phone] given to check cost of previous  14 day monitor and to  receive out of pocket estimate for 2-3 day ZIO XT.

## 2020-12-01 ENCOUNTER — Institutional Professional Consult (permissible substitution): Payer: PPO | Admitting: Cardiology

## 2020-12-02 ENCOUNTER — Telehealth: Payer: Self-pay | Admitting: Cardiovascular Disease

## 2020-12-02 NOTE — Telephone Encounter (Signed)
Patient is requesting clarification regarding instructions for wearing his heart monitor.   The patient is under the impression that he was to start wearing the monitor immediately after his appointment. The patient wants to make sure all the information is received before his appt with Dr Quentin Ore.  Please advise

## 2020-12-02 NOTE — Telephone Encounter (Signed)
LMVM of monitor instructions per 11/22/20 office visit with Dr. Johnsie Cancel.  Continue beta blocker. In 4 weeks repeat 48 hr holter monitor, (12/23/20).  Follow up with Dr. Quentin Ore in 6 weeks, (01/11/21).

## 2020-12-03 ENCOUNTER — Ambulatory Visit: Payer: PPO | Admitting: Student

## 2020-12-06 DIAGNOSIS — E785 Hyperlipidemia, unspecified: Secondary | ICD-10-CM | POA: Diagnosis not present

## 2020-12-06 DIAGNOSIS — L400 Psoriasis vulgaris: Secondary | ICD-10-CM | POA: Diagnosis not present

## 2020-12-06 DIAGNOSIS — Z111 Encounter for screening for respiratory tuberculosis: Secondary | ICD-10-CM | POA: Diagnosis not present

## 2020-12-07 NOTE — Telephone Encounter (Signed)
Chad Beasley concerned because they have not received the ZIO monitor yet.  Explained again, patient was enrolled for monitor to be shipped for 53/3/17 application.  She was very anxious they would not have monitor in time and would like time to review instructions. I have contacted [employer] to have monitor mailed out today instead of targeted delivery date.

## 2020-12-07 NOTE — Telephone Encounter (Signed)
Chad Beasley is calling stating [name] never received the heart monitor.

## 2020-12-15 ENCOUNTER — Ambulatory Visit: Payer: PPO | Admitting: Student

## 2020-12-18 DIAGNOSIS — I493 Ventricular premature depolarization: Secondary | ICD-10-CM

## 2020-12-24 DIAGNOSIS — I493 Ventricular premature depolarization: Secondary | ICD-10-CM | POA: Diagnosis not present

## 2020-12-29 ENCOUNTER — Telehealth: Payer: Self-pay

## 2020-12-29 MED ORDER — CARVEDILOL 6.25 MG PO TABS: 6.2500 mg | ORAL_TABLET | Freq: Two times a day (BID) | ORAL | 11 refills | Status: DC

## 2020-12-29 NOTE — Telephone Encounter (Signed)
-----   Message from Josue Hector, MD sent at 12/29/2020  4:59 PM EST ----- Yes change to coreg 6.25 bid ----- Message ----- From: Michaelyn Barter, RN Sent: 12/29/2020   1:14 PM EST To: Josue Hector, MD  Called patient with results. Patient was wondering if he could change his metoprolol to something else due to side effects. Patient stated he is having bad dreams and does not have any energy. Patient unable to do yard work without having symptoms. Will forward to Dr. Johnsie Cancel to see if patient can take something else. Patient has appointment on 01/11/21 with Dr. Quentin Ore.

## 2020-12-29 NOTE — Telephone Encounter (Signed)
Patient aware of recommendations. Order sent to patient's pharmacy.

## 2021-01-03 ENCOUNTER — Other Ambulatory Visit: Payer: PPO

## 2021-01-11 ENCOUNTER — Encounter: Payer: Self-pay | Admitting: Cardiology

## 2021-01-11 ENCOUNTER — Ambulatory Visit: Payer: PPO | Admitting: Cardiology

## 2021-01-11 ENCOUNTER — Other Ambulatory Visit: Payer: Self-pay

## 2021-01-11 VITALS — BP 130/74 | HR 73 | Ht 71.0 in | Wt 182.6 lb

## 2021-01-11 DIAGNOSIS — I493 Ventricular premature depolarization: Secondary | ICD-10-CM | POA: Diagnosis not present

## 2021-01-11 DIAGNOSIS — I77810 Thoracic aortic ectasia: Secondary | ICD-10-CM | POA: Diagnosis not present

## 2021-01-11 DIAGNOSIS — I10 Essential (primary) hypertension: Secondary | ICD-10-CM | POA: Diagnosis not present

## 2021-01-11 MED ORDER — MEXILETINE HCL 150 MG PO CAPS: 150.0000 mg | ORAL_CAPSULE | Freq: Two times a day (BID) | ORAL | 3 refills | Status: DC

## 2021-01-11 NOTE — Patient Instructions (Addendum)
Medication Instructions:  Your physician has recommended you make the following change in your medication:    START taking mexiletine 150 mg-  Take one capsule by mouth twice a day   Lab Work: None ordered. If you have labs (blood work) drawn today and your tests are completely normal, you will receive your results only by: [place] (if you have MyChart) OR A paper copy in the mail If you have any lab test that is abnormal or we need to change your treatment, we will call you to review the results.  Testing/Procedures: None ordered.  Follow-Up: At [facility], you and your health needs are our priority.  As part of our continuing mission to provide you with exceptional heart care, we have created designated Provider Care Teams.  These Care Teams include your primary Cardiologist (physician) and Advanced Practice Providers (APPs -  Physician Assistants and Nurse Practitioners) who all work together to provide you with the care you need, when you need it.  Your next appointment:   Your physician wants you to follow-up in: 3 months Tommye Standard, Orlinda Blalock "[place], [place]  Your physician wants you to follow-up in: 6 months with Dr. Quentin Ore.      Mexiletine capsules What is this medication? MEXILETINE (mex IL e teen) is an antiarrhythmic agent. This medicine is used to treat irregular heart rhythm and can slow rapid heartbeats. It can help your heart to return to and maintain a normal rhythm. Because of the side effects caused by this medicine, it is usually used for heartbeat problems that may be life-threatening. This medicine may be used for other purposes; ask your health care provider or pharmacist if you have questions. COMMON BRAND NAME(S): Mexitil What should I tell my care team before I take this medication? They need to know if you have any of these conditions: liver disease other heart problems previous heart attack an unusual or allergic reaction to  mexiletine, other medicines, foods, dyes, or preservatives pregnant or trying to get pregnant breast-feeding How should I use this medication? Take this medicine by mouth with a glass of water. Follow the directions on the prescription label. It is recommended that you take this medicine with food or an antacid. Take your doses at regular intervals. Do not take your medicine more often than directed. Do not stop taking except on the advice of your doctor or health care professional. Talk to your pediatrician regarding the use of this medicine in children. Special care may be needed. Overdosage: If you think you have taken too much of this medicine contact a poison control center or emergency room at once. NOTE: This medicine is only for you. Do not share this medicine with others. What if I miss a dose? If you miss a dose, take it as soon as you can. If it is almost time for your next dose, take only that dose. Do not take double or extra doses. What may interact with this medication? Do not take this medicine with any of the following medications: dofetilide This medicine may also interact with the following medications: caffeine cimetidine medicines for depression, anxiety, or psychotic disturbances medicines to control heart rhythm phenobarbital phenytoin rifampin theophylline This list may not describe all possible interactions. Give your health care provider a list of all the medicines, herbs, non-prescription drugs, or dietary supplements you use. Also tell them if you smoke, drink alcohol, or use illegal drugs. Some items may interact with your medicine. What should I watch  for while using this medication? Your condition will be monitored closely when you first begin therapy. Often, this drug is first started in a hospital or other monitored health care setting. Once you are on maintenance therapy, visit your doctor or health care provider for regular checks on your progress. Because  your condition and use of this medicine carry some risk, it is a good idea to carry an identification card, necklace or bracelet with details of your condition, medications, and doctor or health care provider. You may get drowsy or dizzy. Do not drive, use machinery, or do anything that needs mental alertness until you know how this medicine affects you. Do not stand or sit up quickly, especially if you are an older patient. This reduces the risk of dizzy or fainting spells. Alcohol can make you more dizzy, increase flushing and rapid heartbeats. Avoid alcoholic drinks. This medicine may cause serious skin reactions. They can happen weeks to months after starting the medicine. Contact your health care provider right away if you notice fevers or flu-like symptoms with a rash. The rash may be red or purple and then turn into blisters or peeling of the skin. Or, you might notice a red rash with swelling of the face, lips or lymph nodes in your neck or under your arms. What side effects may I notice from receiving this medication? Side effects that you should report to your doctor or health care professional as soon as possible: allergic reactions like skin rash, itching or hives, swelling of the face, lips, or tongue breathing problems chest pain, continued irregular heartbeats rash, fever, and swollen lymph nodes redness, blistering, peeling or loosening of the skin, including inside the mouth seizures skin rash trembling, shaking unusual bleeding or bruising unusually weak or tired Side effects that usually do not require medical attention (report to your doctor or health care professional if they continue or are bothersome): blurred vision difficulty walking heartburn nausea, vomiting nervousness numbness, or tingling in the fingers or toes This list may not describe all possible side effects. Call your doctor for medical advice about side effects. You may report side effects to FDA at  1-800-FDA-1088. Where should I keep my medication? Keep out of reach of children. Store at room temperature between 15 and 30 degrees C (59 and 86 degrees F). Throw away any unused medicine after the expiration date. NOTE: This sheet is a summary. It may not cover all possible information. If you have questions about this medicine, talk to your doctor, pharmacist, or health care provider.  2022 Elsevier/Gold Standard (2018-05-16 00:00:00)

## 2021-01-11 NOTE — Progress Notes (Signed)
Electrophysiology Office Follow up Visit Note:    Date:  01/11/2021   ID:  Chad Beasley, DOB 27-Jun-1950, MRN [id number]  PCP:  London Pepper, MD  [facility] Cardiologist:  [name], MD  [facility] Electrophysiologist:  Vickie Epley, MD    Interval History:    Chad Beasley is a 70 y.o. male who presents for a follow up visit.  He was previously seen by Dr. Rayann Heman for PVCs.  He has a medical history of hypertension, thoracic ascending aortic aneurysm, elevated calcium score, melanoma, prostate issues.  The last appointment with Dr. Rayann Heman was November 04, 2020 for palpitations.  The patient also reported mild presyncope and shortness of breath.  Metoprolol was prescribed and an ischemic evaluation was recommended.  The patient saw Dr. Johnsie Cancel in follow-up November 22, 2020.      Past Medical History:  Diagnosis Date   Ascending aorta dilation (HCC)    CHEST PAIN-UNSPECIFIED    HYPERTENSION    Premature atrial contractions    Premature ventricular contractions    PSORIASIS     Past Surgical History:  Procedure Laterality Date   APPENDECTOMY  80's   removal of melanoma  2016    Current Medications: Current Meds  Medication Sig   Adalimumab 20 MG/0.4ML KIT Inject 40 mg into the skin See admin instructions. 2 times monthly   aspirin EC 81 MG tablet Take 1 tablet (81 mg total) by mouth daily. Swallow whole.   atorvastatin (LIPITOR) 20 MG tablet TAKE 1 TABLET BY MOUTH EVERY DAY   clobetasol cream (TEMOVATE) 6.96 % Apply 1 application topically 2 (two) times daily.   losartan (COZAAR) 100 MG tablet TAKE 1 TABLET BY MOUTH EVERY DAY   metoprolol tartrate (LOPRESSOR) 25 MG tablet Take 25 mg by mouth daily.   mexiletine (MEXITIL) 150 MG capsule Take 1 capsule (150 mg total) by mouth 2 (two) times daily.   Multiple Vitamins-Minerals (CENTRUM SILVER 50+MEN) TABS Take 1 tablet by mouth daily.   Omeprazole (PRILOSEC PO) Take 1 tablet by mouth daily.   vitamin C (ASCORBIC ACID)  500 MG tablet Take 1,000 mg by mouth daily.      Allergies:   Patient has no known allergies.   Social History   Socioeconomic History   Marital status: Divorced    Spouse name: Not on file   Number of children: Not on file   Years of education: Not on file   Highest education level: Not on file  Occupational History   Not on file  Tobacco Use   Smoking status: Never   Smokeless tobacco: Never  Vaping Use   Vaping Use: Never used  Substance and Sexual Activity   Alcohol use: No   Drug use: No   Sexual activity: Not on file  Other Topics Concern   Not on file  Social History Narrative   Not on file   Social Determinants of Health   Financial Resource Strain: Not on file  Food Insecurity: Not on file  Transportation Needs: Not on file  Physical Activity: Not on file  Stress: Not on file  Social Connections: Not on file     Family History: The patient's family history includes Anemia in his father; Cancer in his sister.  ROS:   Please see the history of present illness.    All other systems reviewed and are negative.  EKGs/Labs/Other Studies Reviewed:    The following studies were reviewed today:  Myoview on November 17, 2020 was low  risk without evidence of ischemia.  December 24, 2020 ZIO monitor showed a 17% burden of PVCs.  His beta-blocker was changed to Coreg.  Twelve-lead EKGs dating back to 2018 showed no evidence of PVCs.  EKG on September 14, 2014 shows bigeminal PVCs.  PVCs have a steeply inferior axis and a precordial transition in V3 that slightly precedes the sinus QRS transition.         November 12, 2020 echo Inferolateral and distal anteroseptal/anterolateral hypokinesis EF 50 to 55% Low normal left ventricular function RV function is normal Mild MR    EKG:  The ekg ordered today demonstrates sinus rhythm.  Single PVC on the twelve-lead EKG.  No PVCs on the rhythm strip.  Recent Labs: 07/13/2020: TSH 1.310 10/01/2020: ALT 21; BUN 9;  Creatinine, Ser 0.92; Hemoglobin 13.5; Platelets 266; Potassium 4.4; Sodium 138  Recent Lipid Panel    Component Value Date/Time   CHOL 153 01/20/2020 0730   TRIG 213 (H) 01/20/2020 0730   HDL 65 01/20/2020 0730   CHOLHDL 2.4 01/20/2020 0730   LDLCALC 54 01/20/2020 0730    Physical Exam:    VS:  BP 130/74   Pulse 73   Ht '5\' 11"'  (1.803 m)   Wt 182 lb 9.6 oz (82.8 kg)   SpO2 97%   BMI 25.47 kg/m     Wt Readings from Last 3 Encounters:  01/11/21 182 lb 9.6 oz (82.8 kg)  11/22/20 180 lb 6.4 oz (81.8 kg)  11/04/20 179 lb (81.2 kg)     GEN:  Well nourished, well developed in no acute distress HEENT: Normal NECK: No JVD; No carotid bruits LYMPHATICS: No lymphadenopathy CARDIAC: RRR, no murmurs, rubs, gallops RESPIRATORY:  Clear to auscultation without rales, wheezing or rhonchi  ABDOMEN: Soft, non-tender, non-distended MUSCULOSKELETAL:  No edema; No deformity  SKIN: Warm and dry NEUROLOGIC:  Alert and oriented x 3 PSYCHIATRIC:  Normal affect        ASSESSMENT:    1. PVC's (premature ventricular contractions)   2. Essential hypertension   3. Ascending aorta dilation (HCC)    PLAN:    In order of problems listed above:  #PVCs Symptomatic.  Seem to be worsened by exertion which is consistent with a diagnosis of of lower tract PVCs.  We discussed the management options including continued conservative management, suppression with antiarrhythmic drugs and catheter ablation.  We discussed the pros and cons of each option.  Given his high burden, I do think suppression is important to preserve long-term left ventricular function.  We discussed various antiarrhythmic drug options including mexiletine, sotalol and amiodarone.  For now we will plan to start mexiletine 150 mg by mouth twice daily and monitor for effect.  I will have him see one of our APP's in 3 months to see how he is doing.  They can consider repeating a 14-day ZIO monitor to assess response to therapy.  If he  continues to have recurrent PVCs, could consider transitioning to amiodarone.  This was discussed in detail with the patient and his wife during today's visit.  #Hypertension Controlled.  Continue current therapy  #Ascending aortic dilation Very mild on CT scan.  Routine monitoring by primary cardiologist  Follow-up 3 months with APP with repeat ZIO at that visit    Total time spent with patient today 50 minutes. This includes reviewing records, evaluating the patient and coordinating care.   Medication Adjustments/Labs and Tests Ordered: Current medicines are reviewed at length with the patient today.  Concerns regarding medicines are outlined above.  Orders Placed This Encounter  Procedures   EKG 12-Lead    Meds ordered this encounter  Medications   mexiletine (MEXITIL) 150 MG capsule    Sig: Take 1 capsule (150 mg total) by mouth 2 (two) times daily.    Dispense:  180 capsule    Refill:  3      Signed, Lars Mage, MD, [facility], [facility] 01/11/2021 8:49 PM    Electrophysiology [facility]

## 2021-01-12 ENCOUNTER — Telehealth: Payer: Self-pay | Admitting: Cardiovascular Disease

## 2021-01-12 NOTE — Telephone Encounter (Signed)
Called pt wife ok per DPR.  I informed her that metoprolol is on pt medication list and that carvedilol will be removed.  Medication added at 01/11/21 OV with Dr. Quentin Ore.  Wife is requesting to let Dr. Johnsie Cancel know of medication change.  I will send MD this message as a FYI.

## 2021-01-12 NOTE — Telephone Encounter (Signed)
  Pt c/o medication issue:  1. Name of Medication:  carvedilol (COREG) 6.25 MG tablet  2. How are you currently taking this medication (dosage and times per day)? Stopped taking  3. Are you having a reaction (difficulty breathing--STAT)? no  4. What is your medication issue? Patient's wife states the patient was taking coreg, but stopped because he had side effects. She states he was very sick, SOB, and had more irregular heart beats. She says he tried for several days, but went back to taking the metoprolol. She would like this updated in his chart so he can continue to get refills of the metoprolol. She states they spoke with Dr. Quentin Ore at his last appointment and was told it was fine to continue with the metoprolol.

## 2021-01-24 ENCOUNTER — Encounter: Payer: Self-pay | Admitting: Cardiology

## 2021-01-24 NOTE — Telephone Encounter (Signed)
Spoke with the patient and his wife who report that when they last saw Dr. Quentin Ore he had started him on mexiletine. They had decided to wait until after Thanksgiving to start on it however the patient's heart rate has been very low so they have not yet started it yet. The wife states that the patient's heart rate typically runs in the upper 90s and into the 100s at times. She states that recently in the mornings before he takes his metoprolol his heart rate has been normal but shortly after taking it it drops down into the 50s and sometimes in the 40s. She states that this morning his blood pressure and heart rate were 128/75, 74 prior to taking metoprolol. About an hour after taking his meds heart rate was 41. His heart rate has remained in the 50s throughout the day. She states that she has been taking his pulse with the blood pressure but also manually and she is getting the same results. She reports that the patient has been more fatigued lately and does have some dizziness when he gets up quickly. Advised to ensure patient is staying hydrated and changing positions slowly. Will send message to Dr. Quentin Ore for further advisement on medications.

## 2021-04-18 NOTE — Progress Notes (Signed)
PCP:  London Pepper, MD Primary Cardiologist: [name], MD Electrophysiologist: Vickie Epley, MD   Chad Beasley is a 71 y.o. male seen today for Vickie Epley, MD for routine electrophysiology followup.  Since last being seen in our clinic the patient reports doing well overall. Still having palpitations a decent amount of the time, but has improved on Mexitil. He says he has sat and counted and gone 50-70 beats without having an extra beat, or in as few as 5-7 beats.  he denies chest pain, dyspnea, PND, orthopnea, nausea, vomiting, dizziness, syncope, edema, weight gain, or early satiety.  Past Medical History:  Diagnosis Date   Ascending aorta dilation (HCC)    CHEST PAIN-UNSPECIFIED    HYPERTENSION    Premature atrial contractions    Premature ventricular contractions    PSORIASIS    Past Surgical History:  Procedure Laterality Date   APPENDECTOMY  80's   removal of melanoma  2016    Current Outpatient Medications  Medication Sig Dispense Refill   Adalimumab 20 MG/0.4ML KIT Inject 40 mg into the skin See admin instructions. 2 times monthly     aspirin EC 81 MG tablet Take 1 tablet (81 mg total) by mouth daily. Swallow whole. 30 tablet 11   atorvastatin (LIPITOR) 20 MG tablet TAKE 1 TABLET BY MOUTH EVERY DAY 90 tablet 3   clobetasol cream (TEMOVATE) 8.93 % Apply 1 application topically 2 (two) times daily.     losartan (COZAAR) 100 MG tablet TAKE 1 TABLET BY MOUTH EVERY DAY 90 tablet 2   metoprolol tartrate (LOPRESSOR) 25 MG tablet Take 25 mg by mouth daily.     mexiletine (MEXITIL) 150 MG capsule Take 1 capsule (150 mg total) by mouth 2 (two) times daily. 180 capsule 3   Multiple Vitamins-Minerals (CENTRUM SILVER 50+MEN) TABS Take 1 tablet by mouth daily.     Omeprazole (PRILOSEC PO) Take 1 tablet by mouth daily.     vitamin C (ASCORBIC ACID) 500 MG tablet Take 1,000 mg by mouth daily.      No current facility-administered medications for this visit.    No Known  Allergies  Social History   Socioeconomic History   Marital status: Divorced    Spouse name: Not on file   Number of children: Not on file   Years of education: Not on file   Highest education level: Not on file  Occupational History   Not on file  Tobacco Use   Smoking status: Never   Smokeless tobacco: Never  Vaping Use   Vaping Use: Never used  Substance and Sexual Activity   Alcohol use: No   Drug use: No   Sexual activity: Not on file  Other Topics Concern   Not on file  Social History Narrative   Not on file   Social Determinants of Health   Financial Resource Strain: Not on file  Food Insecurity: Not on file  Transportation Needs: Not on file  Physical Activity: Not on file  Stress: Not on file  Social Connections: Not on file  Intimate Partner Violence: Not on file     Review of Systems: All other systems reviewed and are otherwise negative except as noted above.  Physical Exam: Vitals:   04/19/21 1058  BP: 112/62  Pulse: 61  SpO2: 98%  Weight: 181 lb (82.1 kg)  Height: _0  (1.803 m)    GEN- The patient is well appearing, alert and oriented x 3 today.   HEENT: normocephalic,  atraumatic; sclera clear, conjunctiva pink; hearing intact; oropharynx clear; neck supple, no JVP Lymph- no cervical lymphadenopathy Lungs- Clear to ausculation bilaterally, normal work of breathing.  No wheezes, rales, rhonchi Heart- Regular rate and rhythm, no murmurs, rubs or gallops, PMI not laterally displaced GI- soft, non-tender, non-distended, bowel sounds present, no hepatosplenomegaly Extremities- no clubbing, cyanosis, or edema; DP/PT/radial pulses 2+ bilaterally MS- no significant deformity or atrophy Skin- warm and dry, no rash or lesion Psych- euthymic mood, full affect Neuro- strength and sensation are intact  EKG is not ordered  Additional studies reviewed include: Previous EP office notes.   Assessment and Plan:  1. PVCs Continue lopressor 25 mg  daily Continue mexitil 150 mg  Repeat Zio to re-quantify, and consider amiodarone if burden remains elevated  2. HTN Stable on current regimen   Follow up with Dr. Quentin Ore in 3 months pending monitor   Shirley Friar, PA-C  04/19/21 11:54 AM

## 2021-04-19 ENCOUNTER — Ambulatory Visit: Payer: PPO | Admitting: Student

## 2021-04-19 ENCOUNTER — Other Ambulatory Visit: Payer: Self-pay

## 2021-04-19 ENCOUNTER — Encounter: Payer: Self-pay | Admitting: Student

## 2021-04-19 ENCOUNTER — Ambulatory Visit (INDEPENDENT_AMBULATORY_CARE_PROVIDER_SITE_OTHER): Payer: PPO

## 2021-04-19 VITALS — BP 112/62 | HR 61 | Ht 71.0 in | Wt 181.0 lb

## 2021-04-19 DIAGNOSIS — I10 Essential (primary) hypertension: Secondary | ICD-10-CM | POA: Diagnosis not present

## 2021-04-19 DIAGNOSIS — I493 Ventricular premature depolarization: Secondary | ICD-10-CM | POA: Diagnosis not present

## 2021-04-19 NOTE — Patient Instructions (Signed)
Medication Instructions:  Your physician recommends that you continue on your current medications as directed. Please refer to the Current Medication list given to you today.  *If you need a refill on your cardiac medications before your next appointment, please call your pharmacy*   Lab Work: None If you have labs (blood work) drawn today and your tests are completely normal, you will receive your results only by: [place] (if you have MyChart) OR A paper copy in the mail If you have any lab test that is abnormal or we need to change your treatment, we will call you to review the results.   Follow-Up: At [facility], you and your health needs are our priority.  As part of our continuing mission to provide you with exceptional heart care, we have created designated Provider Care Teams.  These Care Teams include your primary Cardiologist (physician) and Advanced Practice Providers (APPs -  Physician Assistants and Nurse Practitioners) who all work together to provide you with the care you need, when you need it.  Your next appointment:   6 month(s)  The format for your next appointment:   In Person  Provider:   You may see Vickie Epley, MD or one of the following Advanced Practice Providers on your designated Care Team:   Tommye Standard, [place] Legrand Como "Jonni Sanger" Chalmers Cater, [place]    Other Instructions Bryn Gulling- Long Term Monitor Instructions  Your physician has requested you wear a ZIO patch monitor for 3 days.  This is a single patch monitor. [employer] supplies one patch monitor per enrollment. Additional stickers are not available. Please do not apply patch if you will be having a Nuclear Stress Test,  Echocardiogram, Cardiac CT, MRI, or Chest Xray during the period you would be wearing the  monitor. The patch cannot be worn during these tests. You cannot remove and re-apply the  ZIO XT patch monitor.  Your ZIO patch monitor will be mailed 3 day USPS to your address on file. It  may take 3-5 days  to receive your monitor after you have been enrolled.  Once you have received your monitor, please review the enclosed instructions. Your monitor  has already been registered assigning a specific monitor serial # to you.  Billing and Patient Assistance Program Information  We have supplied [employer] with any of your insurance information on file for billing purposes. [employer] offers a sliding scale Patient Assistance Program for patients that do not have  insurance, or whose insurance does not completely cover the cost of the ZIO monitor.  You must apply for the Patient Assistance Program to qualify for this discounted rate.  To apply, please call [employer] at [phone], select option 4, select option 2, ask to apply for  Patient Assistance Program. Theodore Demark will ask your household income, and how many people  are in your household. They will quote your out-of-pocket cost based on that information.  [employer] will also be able to set up a 48-month, interest-free payment plan if needed.  Applying the monitor   Shave hair from upper left chest.  Hold abrader disc by orange tab. Rub abrader in 40 strokes over the upper left chest as  indicated in your monitor instructions.  Clean area with 4 enclosed alcohol pads. Let dry.  Apply patch as indicated in monitor instructions. Patch will be placed under collarbone on left  side of chest with arrow pointing upward.  Rub patch adhesive wings for 2 minutes. Remove white label marked "1". Remove the  white  label marked "2". Rub patch adhesive wings for 2 additional minutes.  While looking in a mirror, press and release button in center of patch. A small green light will  flash 3-4 times. This will be your only indicator that the monitor has been turned on.  Do not shower for the first 24 hours. You may shower after the first 24 hours.  Press the button if you feel a symptom. You will hear a small click. Record Date, Time and  Symptom  in the Patient Logbook.  When you are ready to remove the patch, follow instructions on the last 2 pages of Patient  Logbook. Stick patch monitor onto the last page of Patient Logbook.  Place Patient Logbook in the blue and white box. Use locking tab on box and tape box closed  securely. The blue and white box has prepaid postage on it. Please place it in the mailbox as  soon as possible. Your physician should have your test results approximately 7 days after the  monitor has been mailed back to [facility].  Call Scott at [phone] if you have questions regarding  your ZIO XT patch monitor. Call them immediately if you see an orange light blinking on your  monitor.  If your monitor falls off in less than 4 days, contact our Monitor department at [phone].  If your monitor becomes loose or falls off after 4 days call [employer] at [phone] for  suggestions on securing your monitor

## 2021-04-19 NOTE — Progress Notes (Unsigned)
[id number] ZIO XT from office inventory applied to patient.  Dr. Quentin Ore to read.

## 2021-04-21 ENCOUNTER — Ambulatory Visit: Payer: PPO | Admitting: Cardiovascular Disease

## 2021-04-30 DIAGNOSIS — D225 Melanocytic nevi of trunk: Secondary | ICD-10-CM | POA: Diagnosis not present

## 2021-05-02 DIAGNOSIS — I493 Ventricular premature depolarization: Secondary | ICD-10-CM | POA: Diagnosis not present

## 2021-05-06 ENCOUNTER — Other Ambulatory Visit: Payer: Self-pay

## 2021-05-06 MED ORDER — METOPROLOL SUCCINATE ER 25 MG PO TB24: 25.0000 mg | ORAL_TABLET | Freq: Every day | ORAL | 3 refills | Status: DC

## 2021-05-09 NOTE — Telephone Encounter (Signed)
Spoke with Clair Gulling, [place] pharmacy who states pt has continued to have Metoprolol Succinate '25mg'$  refilled despite this medications being discontinued 12/29/2020 by Dr Johnsie Cancel.  Pt had Metoprolol Tartrate '25mg'$  - listed in his medications from a historical provider. ?

## 2021-05-09 NOTE — Telephone Encounter (Signed)
Attempted phone call to pt and left voicemail message to contact RN re: medications. ?

## 2021-05-10 NOTE — Telephone Encounter (Signed)
Spoke with pt and pt's wife.  Advised per [facility] pharmacy pt has continued to have Metoprolol Succinate '25mg'$  refilled despite this medication being discontinued 12/29/2020 by Dr Johnsie Cancel for pt complaints of dreams and fatigue.  Carvedilol 6.'25mg'$  bid was added but discontinued on 01/12/2021 due to reported side effects.  Pt had Metoprolol Tartrate '25mg'$  - listed in his medications from a historical provider but has not been taking.  Pt confirms he indeed has been taking Metoprolol Succinate '25mg'$  - 1 tablet by mouth daily.  He states he has not had any dreams over the past 10 days. Pt reports he occasionally feels the palpitations.  No other complaints at this time. ?Will forward to Oda Kilts, PA-C to review and make any further recommendations.  Pt verbalizes understanding and agrees with current plan. ? ?

## 2021-07-02 ENCOUNTER — Other Ambulatory Visit: Payer: Self-pay | Admitting: Cardiovascular Disease

## 2021-07-02 DIAGNOSIS — I10 Essential (primary) hypertension: Secondary | ICD-10-CM

## 2021-08-09 ENCOUNTER — Ambulatory Visit: Payer: PPO | Admitting: Cardiology

## 2021-08-09 ENCOUNTER — Encounter: Payer: Self-pay | Admitting: Cardiology

## 2021-08-09 VITALS — BP 122/66 | HR 73 | Ht 71.0 in | Wt 182.0 lb

## 2021-08-09 DIAGNOSIS — I493 Ventricular premature depolarization: Secondary | ICD-10-CM | POA: Diagnosis not present

## 2021-08-09 DIAGNOSIS — Z79899 Other long term (current) drug therapy: Secondary | ICD-10-CM

## 2021-08-09 DIAGNOSIS — I4729 Other ventricular tachycardia: Secondary | ICD-10-CM | POA: Diagnosis not present

## 2021-08-09 DIAGNOSIS — I10 Essential (primary) hypertension: Secondary | ICD-10-CM

## 2021-08-09 NOTE — Progress Notes (Signed)
Electrophysiology Office Follow up Visit Note:    Date:  08/09/2021   ID:  Chad Beasley, DOB March 09, 1950, MRN [id number]  PCP:  London Pepper, MD  [facility] Cardiologist:  [name], MD  [facility] Electrophysiologist:  Vickie Epley, MD    Interval History:    Chad Beasley is a 70 y.o. male who presents for a follow up visit.  He was previously seen by Dr. Rayann Heman for PVCs.  He was last seen in clinic on 01/11/2021.  Since their last appointment, they followed up with Barrington Ellison, PA-C on 04/19/2021 where he was generally doing well. He continued to experience "a decent amount" of palpitations, although they had improved on Mexitil. A repeat Zio was ordered to re-quantify, with consideration of amiodarone if his burden remained elevated. His monitor was notable for 20 short runs of nonsustained ventricular tachycardia, and occasional premature ventricular complexes  (3.5%, 14369).  Today, he is accompanied by a family member. They report several episodes of diaphoresis associated with lower blood pressures. This usually occurs as he is working outside.  He denies any chest pain, shortness of breath, or peripheral edema. No headaches, syncope, orthopnea, or PND.       Past Medical History:  Diagnosis Date   Ascending aorta dilation (HCC)    CHEST PAIN-UNSPECIFIED    HYPERTENSION    Premature atrial contractions    Premature ventricular contractions    PSORIASIS     Past Surgical History:  Procedure Laterality Date   APPENDECTOMY  80's   removal of melanoma  2016    Current Medications: Current Meds  Medication Sig   Adalimumab 20 MG/0.4ML KIT Inject 40 mg into the skin See admin instructions. 2 times monthly   aspirin EC 81 MG tablet Take 1 tablet (81 mg total) by mouth daily. Swallow whole.   atorvastatin (LIPITOR) 20 MG tablet TAKE 1 TABLET BY MOUTH EVERY DAY   clobetasol cream (TEMOVATE) 7.40 % Apply 1 application topically 2 (two) times daily.    losartan (COZAAR) 100 MG tablet TAKE 1 TABLET BY MOUTH EVERY DAY   metoprolol succinate (TOPROL XL) 25 MG 24 hr tablet Take 1 tablet (25 mg total) by mouth at bedtime.   mexiletine (MEXITIL) 150 MG capsule Take 1 capsule (150 mg total) by mouth 2 (two) times daily.   Multiple Vitamins-Minerals (CENTRUM SILVER 50+MEN) TABS Take 1 tablet by mouth daily.   Omeprazole (PRILOSEC PO) Take 1 tablet by mouth daily.   vitamin C (ASCORBIC ACID) 500 MG tablet Take 1,000 mg by mouth daily.      Allergies:   Patient has no known allergies.   Social History   Socioeconomic History   Marital status: Divorced    Spouse name: Not on file   Number of children: Not on file   Years of education: Not on file   Highest education level: Not on file  Occupational History   Not on file  Tobacco Use   Smoking status: Never   Smokeless tobacco: Never  Vaping Use   Vaping Use: Never used  Substance and Sexual Activity   Alcohol use: No   Drug use: No   Sexual activity: Not on file  Other Topics Concern   Not on file  Social History Narrative   Not on file   Social Determinants of Health   Financial Resource Strain: Not on file  Food Insecurity: Not on file  Transportation Needs: Not on file  Physical Activity: Not on file  Stress: Not on file  Social Connections: Not on file     Family History: The patient's family history includes Anemia in his father; Cancer in his sister.  ROS:   Please see the history of present illness.    (+) Diaphoresis All other systems reviewed and are negative.  EKGs/Labs/Other Studies Reviewed:    The following studies were reviewed today:  04/2021  Monitor: Patch Wear Time:  3 days and 18 hours (2023-02-28T11:53:53-0500 to 2023-03-04T06:04:43-0500)   Patient had a min HR of 42 bpm, max HR of 193 bpm, and avg HR of 76 bpm. Predominant underlying rhythm was Sinus Rhythm.    20 Ventricular Tachycardia runs occurred, the run with the fastest interval lasting 4  beats with a max rate of 193 bpm, the longest  lasting 4 beats with an avg rate of 158 bpm.    Isolated SVEs were rare (<1.0%), and no SVE Couplets or SVE Triplets were present.  Isolated VEs were occasional (3.5%, 14369), VE Couplets were rare (<1.0%, 382), and VE Triplets were rare (<1.0%, 37). Ventricular Bigeminy and Trigeminy were present.      Conclusion: This study is remarkable for the following:                       1. Nonsustained ventricular tachycardia - 20 short runs.                       2. Occasional premature ventricular complexes  (3.5%, 14369).  Myoview on November 17, 2020 was low risk without evidence of ischemia.  November 12, 2020 echo Inferolateral and distal anteroseptal/anterolateral hypokinesis EF 50 to 55% Low normal left ventricular function RV function is normal Mild MR  December 24, 2020 ZIO monitor showed a 17% burden of PVCs.  His beta-blocker was changed to Coreg.  Twelve-lead EKGs dating back to 2018 showed no evidence of PVCs.  EKG on September 14, 2014 shows bigeminal PVCs.  PVCs have a steeply inferior axis and a precordial transition in V3 that slightly precedes the sinus QRS transition.           EKG:  EKG is personally reviewed. 08/09/2021: Sinus rhythm.  No PVCs. 01/11/2021: sinus rhythm.  Single PVC on the twelve-lead EKG.  No PVCs on the rhythm strip.  Recent Labs: 10/01/2020: ALT 21; BUN 9; Creatinine, Ser 0.92; Hemoglobin 13.5; Platelets 266; Potassium 4.4; Sodium 138   Recent Lipid Panel    Component Value Date/Time   CHOL 153 01/20/2020 0730   TRIG 213 (H) 01/20/2020 0730   HDL 65 01/20/2020 0730   CHOLHDL 2.4 01/20/2020 0730   LDLCALC 54 01/20/2020 0730    Physical Exam:    VS:  BP 122/66   Pulse 73   Ht '5\' 11"'  (1.803 m)   Wt 182 lb (82.6 kg)   SpO2 98%   BMI 25.38 kg/m     Wt Readings from Last 3 Encounters:  08/09/21 182 lb (82.6 kg)  04/19/21 181 lb (82.1 kg)  01/11/21 182 lb 9.6 oz (82.8 kg)     GEN:   Well nourished, well developed in no acute distress HEENT: Normal NECK: No JVD; No carotid bruits LYMPHATICS: No lymphadenopathy CARDIAC: RRR, no murmurs, rubs, gallops RESPIRATORY:  Clear to auscultation without rales, wheezing or rhonchi  ABDOMEN: Soft, non-tender, non-distended MUSCULOSKELETAL:  No edema; No deformity  SKIN: Warm and dry NEUROLOGIC:  Alert and oriented x 3 PSYCHIATRIC:  Normal affect  ASSESSMENT:    1. PVC (premature ventricular contraction)   2. Nonsustained ventricular tachycardia ([place])   3. Essential hypertension   4. Encounter for long-term (current) use of high-risk medication    PLAN:    In order of problems listed above:  #PVCs #NSVT Doing well on mexiletine.  No PVCs.  Very low burden of PVCs on his recent heart monitor.  I would recommend continuing mexiletine and metoprolol for now.  We did discuss stopping the mexiletine during today's clinic appointment.  If he decides to do this, he will send [place] a message.  No need to wean the drug.  If he were to have recurrence of PVCs after weaning them to mexiletine, would just plan to restart it.  #Hypertension At goal.  Continue current medical therapy.  Check blood pressures 1-2 times per week at home and record these values.  These values she brought to your primary care physician for further medication regimen titration.  Follow-up in 6 months with APP.  Follow-up in 1 year with me.   Medication Adjustments/Labs and Tests Ordered: Current medicines are reviewed at length with the patient today.  Concerns regarding medicines are outlined above.   Orders Placed This Encounter  Procedures   EKG 12-Lead   No orders of the defined types were placed in this encounter.  I,Mathew Stumpf,acting as a Education administrator for Vickie Epley, MD.,have documented all relevant documentation on the behalf of Vickie Epley, MD,as directed by  Vickie Epley, MD while in the presence of Vickie Epley,  MD.  I, Vickie Epley, MD, have reviewed all documentation for this visit. The documentation on 08/09/21 for the exam, diagnosis, procedures, and orders are all accurate and complete.   Signed, Lars Mage, MD, [facility], [facility] 08/09/2021 9:53 PM    Electrophysiology [facility]

## 2021-08-09 NOTE — Patient Instructions (Signed)
Medication Instructions:  Your physician recommends that you continue on your current medications as directed. Please refer to the Current Medication list given to you today. *If you need a refill on your cardiac medications before your next appointment, please call your pharmacy*  Lab Work: None. If you have labs (blood work) drawn today and your tests are completely normal, you will receive your results only by: [place] (if you have MyChart) OR A paper copy in the mail If you have any lab test that is abnormal or we need to change your treatment, we will call you to review the results.  Testing/Procedures: None.  Follow-Up: At [facility], you and your health needs are our priority.  As part of our continuing mission to provide you with exceptional heart care, we have created designated Provider Care Teams.  These Care Teams include your primary Cardiologist (physician) and Advanced Practice Providers (APPs -  Physician Assistants and Nurse Practitioners) who all work together to provide you with the care you need, when you need it.  Your physician wants you to follow-up in: 6 month with one of the following Advanced Practice Providers on your designated Care Team:    Tommye Standard, [place] Legrand Como "Jonni Sanger" [place], [place]    You will receive a reminder letter in the mail two months in advance. If you don't receive a letter, please call our office to schedule the follow-up appointment.  We recommend signing up for the patient portal called "MyChart".  Sign up information is provided on this After Visit Summary.  MyChart is used to connect with patients for Virtual Visits (Telemedicine).  Patients are able to view lab/test results, encounter notes, upcoming appointments, etc.  Non-urgent messages can be sent to your provider as well.   To learn more about what you can do with MyChart, go to [email or web address].    Any Other Special Instructions Will Be Listed Below (If  Applicable).

## 2021-08-11 DIAGNOSIS — H2512 Age-related nuclear cataract, left eye: Secondary | ICD-10-CM | POA: Diagnosis not present

## 2021-10-04 ENCOUNTER — Other Ambulatory Visit: Payer: Self-pay | Admitting: Cardiovascular Disease

## 2021-10-04 DIAGNOSIS — I10 Essential (primary) hypertension: Secondary | ICD-10-CM

## 2021-10-16 ENCOUNTER — Other Ambulatory Visit: Payer: Self-pay | Admitting: Cardiovascular Disease

## 2021-11-27 NOTE — Progress Notes (Unsigned)
Cardiology Office Note    Date:  11/27/2021   ID:  Chad Beasley, DOB 04-Aug-1950, MRN [id number]  PCP:  London Pepper, MD  Cardiologist:  [name], MD  Electrophysiologist:  Vickie Epley, MD   Chief Complaint: f CAD/PVCls   History of Present Illness:   Chad Beasley is a 71 y.o. 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  Calcium score in 2020 was 153, 54%ile, 4cm TAA, aortic atherosclerosis. He recently saw Dr. Irish Lack as DOD with atypical chest pain and HR variation with occasional skips and some orthostatic symptoms. He had mild sinus tachycardia of unclear cause. Labs showed findings below with mild hypercalcemia/elevated albumin and AST, otherwise symptoms felt atypical so no specific further testing was recommended.  F/U myovue done 11/17/20 normal EF 55% TTE EF 50-55% mild MR and AV sclerosis  Monitor 10/29/20 with 15% PVCls started on beta blocker  Seen by Dr Rayann Heman 11/04/20 and hadn't taken beta blocker recommended Continued medical Rx if myovue not ischemic  Started on mexilitene and saw Dr Quentin Ore with decrease in PVC burden 3.8%  His symptoms of malaise, dyspnea and pre syncope seem disproportionate to just having PVCls  ***   Labwork independently reviewed: 06/2020 CMET with Cr 1.09, Ca++ 10.4, albumin 5.0, AST 41, ALT wnl, TSH wnl, WBC 11.2, Hgb 14.7 12/2019 LFTs wnl, LDL 54, Trig 213   Past Medical History:  Diagnosis Date   Ascending aorta dilation ([place])    CHEST PAIN-UNSPECIFIED    HYPERTENSION    Premature atrial contractions    Premature ventricular contractions    PSORIASIS     Past Surgical History:  Procedure Laterality Date   APPENDECTOMY  80's   removal of melanoma  2016    Current Medications: No outpatient medications have been marked  as taking for the 11/30/21 encounter (Appointment) with Josue Hector, MD.      Allergies:   Patient has no known allergies.   Social History   Socioeconomic History   Marital status: Divorced    Spouse name: Not on file   Number of children: Not on file   Years of education: Not on file   Highest education level: Not on file  Occupational History   Not on file  Tobacco Use   Smoking status: Never   Smokeless tobacco: Never  Vaping Use   Vaping Use: Never used  Substance and Sexual Activity   Alcohol use: No   Drug use: No   Sexual activity: Not on file  Other Topics Concern   Not on file  Social History Narrative   Not on file   Social Determinants of Health   Financial Resource Strain: Not on file  Food Insecurity: Not on file  Transportation Needs: Not on file  Physical Activity: Not on file  Stress: Not on file  Social Connections: Not on file     Family History:  The patient's family history includes Anemia in his father; Cancer in his sister.  ROS:   Please see the history of present illness.  All other systems are reviewed and otherwise negative.    EKGs/Labs/Other Studies Reviewed:    Studies reviewed are outlined and summarized above. Reports included below if pertinent.  Myovue:  11/17/20  Study Highlights      The study is normal. The study is low risk.  No ST deviation was noted.   LV perfusion is normal. There is no evidence of ischemia. There is no evidence of infarction.   Left ventricular function is normal. Nuclear stress EF: 55 %. The left ventricular ejection fraction is normal (55-65%). End diastolic cavity size is normal.   Prior study available for comparison from 04/01/2004.   Reduced counts in the inferior segments with improvement on stress imaging and normal wall motion. This is consistent with diaphragm attenuation.  This is a normal study without evidence of ischemia or infarction.  Normal LVEF, 55%. This is a low risk  study.    TTE 11/12/20  IMPRESSIONS     1. Global longitudinal strain is -14.3%      Inferolateral and distal anteroseptal/anterolateral hypokinesis. .  Left ventricular ejection fraction, by estimation, is 50 to 55%. The left  ventricle has low normal function. The left ventricle has no regional wall  motion abnormalities. Left  ventricular diastolic parameters were normal.   2. Right ventricular systolic function is normal. The right ventricular  size is normal.   3. The mitral valve is normal in structure. Mild mitral valve  regurgitation.   4. The aortic valve is tricuspid. Aortic valve regurgitation is not  visualized. Mild to moderate aortic valve sclerosis/calcification is  present, without any evidence of aortic stenosis.   5. The inferior vena cava is normal in size with greater than 50%  respiratory variability, suggesting right atrial pressure of 3 mmHg.   EKG:  EKG is not ordered today  Recent Labs: No results found for requested labs within last 365 days.  Recent Lipid Panel    Component Value Date/Time   CHOL 153 01/20/2020 0730   TRIG 213 (H) 01/20/2020 0730   HDL 65 01/20/2020 0730   CHOLHDL 2.4 01/20/2020 0730   LDLCALC 54 01/20/2020 0730    PHYSICAL EXAM:    VS:  There were no vitals taken for this visit.  BMI: There is no height or weight on file to calculate BMI.  Affect appropriate Healthy:  appears stated age 86: normal Neck supple with no adenopathy JVP normal no bruits no thyromegaly Lungs clear with no wheezing and good diaphragmatic motion Heart:  S1/S2 no murmur, no rub, gallop or click PMI normal Abdomen: benighn, BS positve, no tenderness, no AAA no bruit.  No HSM or HJR Distal pulses intact with no bruits No edema Neuro non-focal Skin warm and dry No muscular weakness   Wt Readings from Last 3 Encounters:  08/09/21 182 lb (82.6 kg)  04/19/21 181 lb (82.1 kg)  01/11/21 182 lb 9.6 oz (82.8 kg)     ASSESSMENT & PLAN:   1.  Sinus tachycardia - resolved, suspect this may have been due to under hydration. He will notify for recurrent sx. Now on beta blocker   2. PVC: high burden in setting of normal LV function and non ischemic myovue Seen by Dr Quentin Ore and on beta blocker and mexilitene   3. Aneurysmal ascending thoracic aorta - CTA 10/29/20 only ectasia 3.9 cm stable   4. Essential HTN - controlled.    5. CAD:  calcium score 02/05/19 153 in LAD/Circumflex 54 th percentile continue ASA/Statin  Non ischemic myovue  11/17/20   Disposition: F/u  Dr Quentin Ore 6 months and me in a year    Medication Adjustments/Labs and Tests Ordered: Current medicines are reviewed at length with the patient today.  Concerns regarding medicines are outlined above. Medication changes, Labs and Tests ordered  today are summarized above and listed in the Patient Instructions accessible in Encounters.   Signed, [name], MD  11/27/2021 8:35 PM    [address], [place], [place]  [address] Phone: [phone]; Fax: [phone]

## 2021-11-30 ENCOUNTER — Encounter: Payer: Self-pay | Admitting: Cardiovascular Disease

## 2021-11-30 ENCOUNTER — Ambulatory Visit: Payer: PPO | Attending: Cardiovascular Disease | Admitting: Cardiovascular Disease

## 2021-11-30 VITALS — BP 120/80 | HR 92 | Ht 71.0 in | Wt 180.8 lb

## 2021-11-30 DIAGNOSIS — R000 Tachycardia, unspecified: Secondary | ICD-10-CM

## 2021-11-30 DIAGNOSIS — I493 Ventricular premature depolarization: Secondary | ICD-10-CM

## 2021-11-30 DIAGNOSIS — I10 Essential (primary) hypertension: Secondary | ICD-10-CM

## 2021-11-30 DIAGNOSIS — I2510 Atherosclerotic heart disease of native coronary artery without angina pectoris: Secondary | ICD-10-CM | POA: Diagnosis not present

## 2021-11-30 NOTE — Patient Instructions (Signed)
Medication Instructions:  Your physician recommends that you continue on your current medications as directed. Please refer to the Current Medication list given to you today.  *If you need a refill on your cardiac medications before your next appointment, please call your pharmacy*  Lab Work: If you have labs (blood work) drawn today and your tests are completely normal, you will receive your results only by: [place] (if you have MyChart) OR A paper copy in the mail If you have any lab test that is abnormal or we need to change your treatment, we will call you to review the results.  Testing/Procedures: None ordered today.   Follow-Up: At [facility], you and your health needs are our priority.  As part of our continuing mission to provide you with exceptional heart care, we have created designated Provider Care Teams.  These Care Teams include your primary Cardiologist (physician) and Advanced Practice Providers (APPs -  Physician Assistants and Nurse Practitioners) who all work together to provide you with the care you need, when you need it.  We recommend signing up for the patient portal called "MyChart".  Sign up information is provided on this After Visit Summary.  MyChart is used to connect with patients for Virtual Visits (Telemedicine).  Patients are able to view lab/test results, encounter notes, upcoming appointments, etc.  Non-urgent messages can be sent to your provider as well.   To learn more about what you can do with MyChart, go to [email or web address].    Your next appointment:   2 month(s)  The format for your next appointment:   In Person  Provider:   You will see one of the following Advanced Practice Providers on your designated Care Team:   Tommye Standard, [place] Legrand Como "Jonni Sanger" Chalmers Cater, [place]  Then, Dr. Johnsie Cancel will plan to see you again in 1 year(s).     Important Information About Sugar

## 2022-01-02 ENCOUNTER — Other Ambulatory Visit: Payer: Self-pay | Admitting: Cardiovascular Disease

## 2022-01-02 DIAGNOSIS — I10 Essential (primary) hypertension: Secondary | ICD-10-CM

## 2022-01-13 ENCOUNTER — Other Ambulatory Visit: Payer: Self-pay | Admitting: Cardiology

## 2022-01-24 ENCOUNTER — Telehealth: Payer: Self-pay | Admitting: Student

## 2022-01-24 NOTE — Telephone Encounter (Signed)
Patient states he is returning a call from [place].

## 2022-01-28 DIAGNOSIS — D485 Neoplasm of uncertain behavior of skin: Secondary | ICD-10-CM | POA: Diagnosis not present

## 2022-01-28 DIAGNOSIS — L299 Pruritus, unspecified: Secondary | ICD-10-CM | POA: Diagnosis not present

## 2022-01-28 DIAGNOSIS — L400 Psoriasis vulgaris: Secondary | ICD-10-CM | POA: Diagnosis not present

## 2022-01-28 DIAGNOSIS — D225 Melanocytic nevi of trunk: Secondary | ICD-10-CM | POA: Diagnosis not present

## 2022-02-02 NOTE — Progress Notes (Signed)
PCP:  London Pepper, MD Primary Cardiologist: [name], MD Electrophysiologist: Vickie Epley, MD   Chad Beasley is a 71 y.o. male seen today for Vickie Epley, MD for routine electrophysiology followup. Since last being seen in our clinic the patient reports doing very well. He had some moles injected 12/9 and had some mild lightheadedness later that day. HRs 90-110s. Otherwise,  he denies chest pain, palpitations, dyspnea, PND, orthopnea, nausea, vomiting, dizziness, syncope, edema, weight gain, or early satiety.  Past Medical History:  Diagnosis Date   Ascending aorta dilation (HCC)    CHEST PAIN-UNSPECIFIED    HYPERTENSION    Premature atrial contractions    Premature ventricular contractions    PSORIASIS    Past Surgical History:  Procedure Laterality Date   APPENDECTOMY  80's   removal of melanoma  2016    Current Outpatient Medications  Medication Sig Dispense Refill   Adalimumab 20 MG/0.4ML KIT Inject 40 mg into the skin See admin instructions. 2 times monthly     aspirin EC 81 MG tablet Take 1 tablet (81 mg total) by mouth daily. Swallow whole. 30 tablet 11   atorvastatin (LIPITOR) 20 MG tablet TAKE 1 TABLET BY MOUTH EVERY DAY 90 tablet 2   clobetasol cream (TEMOVATE) 8.25 % Apply 1 application topically 2 (two) times daily.     losartan (COZAAR) 100 MG tablet Take 1 tablet (100 mg total) by mouth daily. 90 tablet 0   metoprolol succinate (TOPROL XL) 25 MG 24 hr tablet Take 1 tablet (25 mg total) by mouth at bedtime. 90 tablet 3   mexiletine (MEXITIL) 150 MG capsule TAKE 1 CAPSULE BY MOUTH TWICE A DAY 180 capsule 3   Multiple Vitamins-Minerals (CENTRUM SILVER 50+MEN) TABS Take 1 tablet by mouth daily.     Omeprazole (PRILOSEC PO) Take 1 tablet by mouth daily.     vitamin C (ASCORBIC ACID) 500 MG tablet Take 1,000 mg by mouth daily.      No current facility-administered medications for this visit.    No Known Allergies  Social History   Socioeconomic  History   Marital status: Divorced    Spouse name: Not on file   Number of children: Not on file   Years of education: Not on file   Highest education level: Not on file  Occupational History   Not on file  Tobacco Use   Smoking status: Never   Smokeless tobacco: Never  Vaping Use   Vaping Use: Never used  Substance and Sexual Activity   Alcohol use: No   Drug use: No   Sexual activity: Not on file  Other Topics Concern   Not on file  Social History Narrative   Not on file   Social Determinants of Health   Financial Resource Strain: Low Risk  (02/08/2022)   Overall Financial Resource Strain (CARDIA)    Difficulty of Paying Living Expenses: Not hard at all  Food Insecurity: No Food Insecurity (02/08/2022)   Hunger Vital Sign    Worried About Running Out of Food in the Last Year: Never true    [place] in the Last Year: Never true  Transportation Needs: No Transportation Needs (02/08/2022)   PRAPARE - Hydrologist (Medical): No    Lack of Transportation (Non-Medical): No  Physical Activity: Not on file  Stress: Not on file  Social Connections: Not on file  Intimate Partner Violence: Not on file    Review of  Systems: All other systems reviewed and are otherwise negative except as noted above.  Physical Exam: Vitals:   02/08/22 0857  BP: 118/66  Pulse: 72  SpO2: 98%  Weight: 188 lb 12.8 oz (85.6 kg)  Height: _0  (1.803 m)    GEN- The patient is well appearing, alert and oriented x 3 today.   HEENT: normocephalic, atraumatic; sclera clear, conjunctiva pink; hearing intact; oropharynx clear; neck supple, no JVP Lymph- no cervical lymphadenopathy Lungs- Clear to ausculation bilaterally, normal work of breathing.  No wheezes, rales, rhonchi Heart- Regular rate and rhythm, no murmurs, rubs or gallops, PMI not laterally displaced GI- soft, non-tender, non-distended, bowel sounds present, no hepatosplenomegaly Extremities- No  peripheral edema. no clubbing or cyanosis; DP/PT/radial pulses 2+ bilaterally MS- no significant deformity or atrophy Skin- warm and dry, no rash or lesion Psych- euthymic mood, full affect Neuro- strength and sensation are intact  EKG is not ordered. Personal review of EKG from 08/08/2021 shows NSR at 73 bpm  Additional studies reviewed include: Previous EP notes.   Assessment and Plan:  1. PVCs 2. NSVT Continue lopressor 25 mg daily Continue mexitil 150 mg  No PVCs while listening for an extended period today.   3. HTN Stable on current regimen   Follow up with Dr. Quentin Ore in 6 months  Shirley Friar, PA-C  02/08/22 9:05 AM

## 2022-02-06 ENCOUNTER — Ambulatory Visit: Payer: PPO | Admitting: Student

## 2022-02-08 ENCOUNTER — Ambulatory Visit: Payer: PPO | Attending: Student | Admitting: Student

## 2022-02-08 ENCOUNTER — Encounter: Payer: Self-pay | Admitting: Student

## 2022-02-08 VITALS — BP 118/66 | HR 72 | Ht 71.0 in | Wt 188.8 lb

## 2022-02-08 DIAGNOSIS — I4729 Other ventricular tachycardia: Secondary | ICD-10-CM

## 2022-02-08 DIAGNOSIS — I10 Essential (primary) hypertension: Secondary | ICD-10-CM | POA: Diagnosis not present

## 2022-02-08 DIAGNOSIS — I493 Ventricular premature depolarization: Secondary | ICD-10-CM

## 2022-02-08 NOTE — Patient Instructions (Addendum)
Medication Instructions:  Your physician recommends that you continue on your current medications as directed. Please refer to the Current Medication list given to you today.   *If you need a refill on your cardiac medications before your next appointment, please call your pharmacy*   Lab Work: None If you have labs (blood work) drawn today and your tests are completely normal, you will receive your results only by: [place] (if you have MyChart) OR A paper copy in the mail If you have any lab test that is abnormal or we need to change your treatment, we will call you to review the results.   Follow-Up: At [facility], you and your health needs are our priority.  As part of our continuing mission to provide you with exceptional heart care, we have created designated Provider Care Teams.  These Care Teams include your primary Cardiologist (physician) and Advanced Practice Providers (APPs -  Physician Assistants and Nurse Practitioners) who all work together to provide you with the care you need, when you need it.  Your next appointment:   6 month(s)  The format for your next appointment:   In Person  Provider:   Lars Mage, MD    Important Information About Sugar

## 2022-04-05 ENCOUNTER — Other Ambulatory Visit: Payer: Self-pay | Admitting: Cardiovascular Disease

## 2022-04-05 DIAGNOSIS — I10 Essential (primary) hypertension: Secondary | ICD-10-CM

## 2022-04-29 DIAGNOSIS — D485 Neoplasm of uncertain behavior of skin: Secondary | ICD-10-CM | POA: Diagnosis not present

## 2022-04-29 DIAGNOSIS — L400 Psoriasis vulgaris: Secondary | ICD-10-CM | POA: Diagnosis not present

## 2022-04-29 DIAGNOSIS — D225 Melanocytic nevi of trunk: Secondary | ICD-10-CM | POA: Diagnosis not present

## 2022-05-04 DIAGNOSIS — H2513 Age-related nuclear cataract, bilateral: Secondary | ICD-10-CM | POA: Diagnosis not present

## 2022-05-04 DIAGNOSIS — H52203 Unspecified astigmatism, bilateral: Secondary | ICD-10-CM | POA: Diagnosis not present

## 2022-06-06 DIAGNOSIS — H52201 Unspecified astigmatism, right eye: Secondary | ICD-10-CM | POA: Diagnosis not present

## 2022-06-06 DIAGNOSIS — H269 Unspecified cataract: Secondary | ICD-10-CM | POA: Diagnosis not present

## 2022-06-06 DIAGNOSIS — H2511 Age-related nuclear cataract, right eye: Secondary | ICD-10-CM | POA: Diagnosis not present

## 2022-06-06 DIAGNOSIS — Z961 Presence of intraocular lens: Secondary | ICD-10-CM | POA: Diagnosis not present

## 2022-07-04 DIAGNOSIS — Z961 Presence of intraocular lens: Secondary | ICD-10-CM | POA: Diagnosis not present

## 2022-07-04 DIAGNOSIS — H25812 Combined forms of age-related cataract, left eye: Secondary | ICD-10-CM | POA: Diagnosis not present

## 2022-07-04 DIAGNOSIS — H2512 Age-related nuclear cataract, left eye: Secondary | ICD-10-CM | POA: Diagnosis not present

## 2022-07-11 ENCOUNTER — Other Ambulatory Visit: Payer: Self-pay | Admitting: Cardiovascular Disease

## 2022-07-11 ENCOUNTER — Other Ambulatory Visit: Payer: Self-pay | Admitting: Student

## 2022-08-10 NOTE — Progress Notes (Signed)
  Electrophysiology Office Follow up Visit Note:    Date:  08/11/2022   ID:  Chad Beasley, DOB 1950/10/06, MRN [id number]  PCP:  Chad Has, MD  [facility] Cardiologist:  Charlton Haws, MD  [facility] Electrophysiologist:  Chad Prude, MD    Interval History:    Chad Beasley is a 72 y.o. male who presents for a follow up visit.   Last seen August 09, 2021 for PVCs.  The patient was previously followed by Dr. [name].  The patient was on mexiletine.  He had a very low burden of PVCs.  The patient saw Mardelle Matte in clinic February 08, 2022.  At that appointment he reported doing well.  He was still taking Lopressor and mexiletine.  He tells me that sometime ago he had an episode of low blood pressure on his home cough with an elevated heart rate.  It did not last and he Beasley not had another episode.  He does not use a heart rhythm monitoring device at home.    Past medical, surgical, social and family history were reviewed.  ROS:   Please see the history of present illness.    All other systems reviewed and are negative.  EKGs/Labs/Other Studies Reviewed:    The following studies were reviewed today:   EKG Interpretation  Date/Time:  Friday August 11 2022 10:34:34 EDT Ventricular Rate:  74 PR Interval:  180 QRS Duration: 80 QT Interval:  358 QTC Calculation: 397 R Axis:   -7 Text Interpretation: Normal sinus rhythm Normal ECG When compared with ECG of 12-Feb-2012 15:37, Premature ventricular complexes are no longer Present Confirmed by Chad Beasley [phone]) on 08/11/2022 10:44:37 AM    Physical Exam:    VS:  BP 116/74   Pulse 78   Ht 5\' 11"  (1.803 m)   Wt 179 lb 3.2 oz (81.3 kg)   SpO2 97%   BMI 24.99 kg/m     Wt Readings from Last 3 Encounters:  08/11/22 179 lb 3.2 oz (81.3 kg)  02/08/22 188 lb 12.8 oz (85.6 kg)  11/30/21 180 lb 12.8 oz (82 kg)     GEN:  Well nourished, well developed in no acute distress CARDIAC: RRR, no murmurs, rubs,  gallops RESPIRATORY:  Clear to auscultation without rales, wheezing or rhonchi       ASSESSMENT:    1. PVC's (premature ventricular contractions)   2. Nonsustained ventricular tachycardia (HCC)   3. Encounter for long-term (current) use of high-risk medication    PLAN:    In order of problems listed above:  #Frequent PVCs #NSVT #High risk med monitoring-mexiletine The patient is doing well on mexiletine and metoprolol for PVC suppression.  Recommend continuing this regimen for now.  #Elevated heart rate episode Unclear cause.  No history of atrial fibrillation.  I encouraged him to use a Kardia mobile device or similar device at home for intermittent heart rhythm monitoring.  Follow-up 6 months with EP APP.      Signed, Chad Dunn, MD, [facility] 08/11/2022 10:51 AM    Electrophysiology  [facility]

## 2022-08-11 ENCOUNTER — Ambulatory Visit: Payer: PPO | Attending: Cardiology | Admitting: Cardiology

## 2022-08-11 ENCOUNTER — Encounter: Payer: Self-pay | Admitting: Cardiology

## 2022-08-11 VITALS — BP 116/74 | HR 78 | Ht 71.0 in | Wt 179.2 lb

## 2022-08-11 DIAGNOSIS — I493 Ventricular premature depolarization: Secondary | ICD-10-CM

## 2022-08-11 DIAGNOSIS — I4729 Other ventricular tachycardia: Secondary | ICD-10-CM

## 2022-08-11 DIAGNOSIS — Z79899 Other long term (current) drug therapy: Secondary | ICD-10-CM | POA: Diagnosis not present

## 2022-08-11 NOTE — Patient Instructions (Signed)
Medication Instructions:  Your physician recommends that you continue on your current medications as directed. Please refer to the Current Medication list given to you today.  *If you need a refill on your cardiac medications before your next appointment, please call your pharmacy*  Follow-Up: At [facility], you and your health needs are our priority.  As part of our continuing mission to provide you with exceptional heart care, we have created designated Provider Care Teams.  These Care Teams include your primary Cardiologist (physician) and Advanced Practice Providers (APPs -  Physician Assistants and Nurse Practitioners) who all work together to provide you with the care you need, when you need it.  Your next appointment:   6 month(s)  Provider:   You will see one of the following Advanced Practice Providers on your designated Care Team:   Renee Ursuy, PA-C Michael "Andy" Tillery, PA-C Suzann Riddle, NP   

## 2022-08-16 ENCOUNTER — Encounter: Payer: Self-pay | Admitting: Cardiology

## 2022-10-04 ENCOUNTER — Other Ambulatory Visit: Payer: Self-pay | Admitting: Cardiovascular Disease

## 2022-10-04 DIAGNOSIS — I10 Essential (primary) hypertension: Secondary | ICD-10-CM

## 2022-10-24 DIAGNOSIS — R0981 Nasal congestion: Secondary | ICD-10-CM | POA: Diagnosis not present

## 2022-10-24 DIAGNOSIS — J029 Acute pharyngitis, unspecified: Secondary | ICD-10-CM | POA: Diagnosis not present

## 2022-10-24 DIAGNOSIS — Z03818 Encounter for observation for suspected exposure to other biological agents ruled out: Secondary | ICD-10-CM | POA: Diagnosis not present

## 2022-12-07 DIAGNOSIS — R0982 Postnasal drip: Secondary | ICD-10-CM | POA: Diagnosis not present

## 2022-12-07 DIAGNOSIS — R070 Pain in throat: Secondary | ICD-10-CM | POA: Diagnosis not present

## 2022-12-07 NOTE — Progress Notes (Signed)
Cardiology Office Note:  .   Date:  12/19/2022  ID:  Chad Beasley, DOB October 12, 1950, MRN [id number] PCP: Farris Has, MD  [facility] Providers Cardiologist:  Charlton Haws, MD Electrophysiologist:  Lanier Prude, MD    History of Present Illness: .   Chad Beasley is a 72 y.o. male with history of CAD, HTN, NSVT/freq PVC's on mexilitine, aneurysmal thoracic ascending aorta.  Patient comes in with his wife. If he's working in the heat his BP can drop 88 systolic and it takes time to bring it up with gatorade. He tries to stay hydrated but only Drinks 3-4 glasses water a day. Drinks a Coke a day, 6 beers daily. No regular exercise. No chest pain or dyspnea. Wife says BP has been trending down and pulse in running 105 after mowing the yard or walking up stairs.  ROS:    Studies Reviewed: Marland Kitchen         Prior CV Studies:   Myovue:  11/17/20   Study Highlights       The study is normal. The study is low risk.   No ST deviation was noted.   LV perfusion is normal. There is no evidence of ischemia. There is no evidence of infarction.   Left ventricular function is normal. Nuclear stress EF: 55 %. The left ventricular ejection fraction is normal (55-65%). End diastolic cavity size is normal.   Prior study available for comparison from 04/01/2004.   Reduced counts in the inferior segments with improvement on stress imaging and normal wall motion. This is consistent with diaphragm attenuation.  This is a normal study without evidence of ischemia or infarction.  Normal LVEF, 55%. This is a low risk study.      TTE 11/12/20   IMPRESSIONS     1. Global longitudinal strain is -14.3%      Inferolateral and distal anteroseptal/anterolateral hypokinesis. .  Left ventricular ejection fraction, by estimation, is 50 to 55%. The left  ventricle has low normal function. The left ventricle has no regional wall  motion abnormalities. Left  ventricular diastolic parameters were normal.   2.  Right ventricular systolic function is normal. The right ventricular  size is normal.   3. The mitral valve is normal in structure. Mild mitral valve  regurgitation.   4. The aortic valve is tricuspid. Aortic valve regurgitation is not  visualized. Mild to moderate aortic valve sclerosis/calcification is  present, without any evidence of aortic stenosis.   5. The inferior vena cava is normal in size with greater than 50%  respiratory variability, suggesting right atrial pressure of 3 mmHg.     Risk Assessment/Calculations:             Physical Exam:   VS:  BP 114/66   Pulse 91   Ht 5\' 11"  (1.803 m)   Wt 181 lb 6.4 oz (82.3 kg)   SpO2 98%   BMI 25.30 kg/m    Wt Readings from Last 3 Encounters:  12/19/22 181 lb 6.4 oz (82.3 kg)  08/11/22 179 lb 3.2 oz (81.3 kg)  02/08/22 188 lb 12.8 oz (85.6 kg)    GEN: Well nourished, well developed in no acute distress NECK: No JVD; No carotid bruits CARDIAC:  RRR, no murmurs, rubs, gallops RESPIRATORY:  Clear to auscultation without rales, wheezing or rhonchi  ABDOMEN: Soft, non-tender, non-distended EXTREMITIES:  No edema; No deformity   ASSESSMENT AND PLAN: .    Dizziness worse after working in the heat and  not staying hydrated. He needs to drink 64 ounces water daily and decrease alcohol intake.check labs today. Not Orthostatic here today so I think his biggest problem is hydration. Will also check labs.  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 Lalla Brothers  HTN-trending lower than in the past 90-120 systolic per wife. Will try to decrease losartan 50 mg daily. They will send BP readings in to me via mychart to make sure BP doesn't get too high.   Ascending aorta dilation CTA 10/2020 3.9 cm  CAD with elevated calcium score 153, LAD/Cfx  2020, low risk myoview 10/2020-no angina.        Dispo: f/u with Dr. Eden Emms in 1 yr.  Signed, Jacolyn Reedy, PA-C

## 2022-12-19 ENCOUNTER — Encounter: Payer: Self-pay | Admitting: Physician Assistant

## 2022-12-19 ENCOUNTER — Ambulatory Visit: Payer: PPO | Attending: Physician Assistant | Admitting: Physician Assistant

## 2022-12-19 VITALS — BP 114/66 | HR 91 | Ht 71.0 in | Wt 181.4 lb

## 2022-12-19 DIAGNOSIS — I77810 Thoracic aortic ectasia: Secondary | ICD-10-CM | POA: Diagnosis not present

## 2022-12-19 DIAGNOSIS — I4729 Other ventricular tachycardia: Secondary | ICD-10-CM | POA: Diagnosis not present

## 2022-12-19 DIAGNOSIS — R42 Dizziness and giddiness: Secondary | ICD-10-CM

## 2022-12-19 DIAGNOSIS — I493 Ventricular premature depolarization: Secondary | ICD-10-CM | POA: Diagnosis not present

## 2022-12-19 DIAGNOSIS — I2510 Atherosclerotic heart disease of native coronary artery without angina pectoris: Secondary | ICD-10-CM

## 2022-12-19 DIAGNOSIS — I10 Essential (primary) hypertension: Secondary | ICD-10-CM

## 2022-12-19 MED ORDER — LOSARTAN POTASSIUM 50 MG PO TABS: 50.0000 mg | ORAL_TABLET | Freq: Every day | ORAL | 3 refills | Status: DC

## 2022-12-19 NOTE — Patient Instructions (Signed)
Medication Instructions:  Your physician has recommended you make the following change in your medication:  DECREASE LOSARTAN TO 50 MG DAILY.   *If you need a refill on your cardiac medications before your next appointment, please call your pharmacy*   Lab Work: TODAY: CMET, CBC, FASTING LIPIDS, TSH If you have labs (blood work) drawn today and your tests are completely normal, you will receive your results only by: MyChart Message (if you have MyChart) OR A paper copy in the mail If you have any lab test that is abnormal or we need to change your treatment, we will call you to review the results.   Testing/Procedures: NONE   Follow-Up: At [facility], you and your health needs are our priority.  As part of our continuing mission to provide you with exceptional heart care, we have created designated Provider Care Teams.  These Care Teams include your primary Cardiologist (physician) and Advanced Practice Providers (APPs -  Physician Assistants and Nurse Practitioners) who all work together to provide you with the care you need, when you need it.  We recommend signing up for the patient portal called "MyChart".  Sign up information is provided on this After Visit Summary.  MyChart is used to connect with patients for Virtual Visits (Telemedicine).  Patients are able to view lab/test results, encounter notes, upcoming appointments, etc.  Non-urgent messages can be sent to your provider as well.   To learn more about what you can do with MyChart, go to [email or web address].    Your next appointment:   1 year(s)  Provider:   Charlton Haws, MD    Other Instructions YOUR PROVIDER RECOMMENDS THAT YOU DECREASE YOU INTAKE OF ALCOHOL.

## 2022-12-20 LAB — CBC
Hematocrit: 44.8 % (ref 37.5–51.0)
Hemoglobin: 14.2 g/dL (ref 13.0–17.7)
MCH: 30.2 pg (ref 26.6–33.0)
MCHC: 31.7 g/dL (ref 31.5–35.7)
MCV: 95 fL (ref 79–97)
Platelets: 259 10*3/uL (ref 150–450)
RBC: 4.7 x10E6/uL (ref 4.14–5.80)
RDW: 11.7 % (ref 11.6–15.4)
WBC: 9.3 10*3/uL (ref 3.4–10.8)

## 2022-12-20 LAB — COMPREHENSIVE METABOLIC PANEL
ALT: 23 [IU]/L (ref 0–44)
AST: 31 [IU]/L (ref 0–40)
Albumin: 4.5 g/dL (ref 3.8–4.8)
Alkaline Phosphatase: 89 [IU]/L (ref 44–121)
BUN/Creatinine Ratio: 12 (ref 10–24)
BUN: 13 mg/dL (ref 8–27)
Bilirubin Total: 0.5 mg/dL (ref 0.0–1.2)
CO2: 22 mmol/L (ref 20–29)
Calcium: 9.8 mg/dL (ref 8.6–10.2)
Chloride: 105 mmol/L (ref 96–106)
Creatinine, Ser: 1.08 mg/dL (ref 0.76–1.27)
Globulin, Total: 2.6 g/dL (ref 1.5–4.5)
Glucose: 112 mg/dL — ABNORMAL HIGH (ref 70–99)
Potassium: 4 mmol/L (ref 3.5–5.2)
Sodium: 139 mmol/L (ref 134–144)
Total Protein: 7.1 g/dL (ref 6.0–8.5)
eGFR: 73 mL/min/{1.73_m2} (ref >59)

## 2022-12-20 LAB — LIPID PANEL
Chol/HDL Ratio: 2.5 ratio (ref 0.0–5.0)
Cholesterol, Total: 168 mg/dL (ref 100–199)
HDL: 67 mg/dL (ref >39)
LDL Chol Calc (NIH): 68 mg/dL (ref 0–99)
Triglycerides: 203 mg/dL — ABNORMAL HIGH (ref 0–149)
VLDL Cholesterol Cal: 33 mg/dL (ref 5–40)

## 2022-12-20 LAB — TSH: TSH: 1.51 u[IU]/mL (ref 0.450–4.500)

## 2022-12-27 DIAGNOSIS — M7662 Achilles tendinitis, left leg: Secondary | ICD-10-CM | POA: Diagnosis not present

## 2023-01-02 DIAGNOSIS — Z1212 Encounter for screening for malignant neoplasm of rectum: Secondary | ICD-10-CM | POA: Diagnosis not present

## 2023-01-03 ENCOUNTER — Other Ambulatory Visit: Payer: Self-pay | Admitting: Cardiology

## 2023-01-04 ENCOUNTER — Telehealth: Payer: Self-pay | Admitting: Physician Assistant

## 2023-01-04 ENCOUNTER — Other Ambulatory Visit: Payer: Self-pay | Admitting: Cardiovascular Disease

## 2023-01-04 DIAGNOSIS — I10 Essential (primary) hypertension: Secondary | ICD-10-CM

## 2023-01-04 NOTE — Telephone Encounter (Signed)
Spoke with patient and he states he is doing good with the 50 mg losartan . His BP has been good/normal. He wanted you know that he is staying hydrated as well and likes how he feels. States he feels better.

## 2023-01-04 NOTE — Telephone Encounter (Signed)
Pt called in to stating the medication Losartan 50mg  tablets and working better than the 100mg  and he wishes to stay on it. I informed him he has two more refills that will last until 03/18/22. He also asked to speak to nurse about it.

## 2023-01-06 DIAGNOSIS — L820 Inflamed seborrheic keratosis: Secondary | ICD-10-CM | POA: Diagnosis not present

## 2023-01-06 DIAGNOSIS — L400 Psoriasis vulgaris: Secondary | ICD-10-CM | POA: Diagnosis not present

## 2023-01-06 DIAGNOSIS — L299 Pruritus, unspecified: Secondary | ICD-10-CM | POA: Diagnosis not present

## 2023-01-06 DIAGNOSIS — L821 Other seborrheic keratosis: Secondary | ICD-10-CM | POA: Diagnosis not present

## 2023-01-10 DIAGNOSIS — Z111 Encounter for screening for respiratory tuberculosis: Secondary | ICD-10-CM | POA: Diagnosis not present

## 2023-01-10 DIAGNOSIS — Z79899 Other long term (current) drug therapy: Secondary | ICD-10-CM | POA: Diagnosis not present

## 2023-01-10 DIAGNOSIS — L400 Psoriasis vulgaris: Secondary | ICD-10-CM | POA: Diagnosis not present

## 2023-01-10 DIAGNOSIS — R531 Weakness: Secondary | ICD-10-CM | POA: Diagnosis not present

## 2023-01-16 DIAGNOSIS — H43812 Vitreous degeneration, left eye: Secondary | ICD-10-CM | POA: Diagnosis not present

## 2023-01-16 DIAGNOSIS — H01005 Unspecified blepharitis left lower eyelid: Secondary | ICD-10-CM | POA: Diagnosis not present

## 2023-01-22 ENCOUNTER — Other Ambulatory Visit (HOSPITAL_COMMUNITY): Payer: Self-pay | Admitting: Family Medicine

## 2023-01-22 DIAGNOSIS — R002 Palpitations: Secondary | ICD-10-CM | POA: Diagnosis not present

## 2023-01-22 DIAGNOSIS — K8050 Calculus of bile duct without cholangitis or cholecystitis without obstruction: Secondary | ICD-10-CM | POA: Diagnosis not present

## 2023-01-22 DIAGNOSIS — K828 Other specified diseases of gallbladder: Secondary | ICD-10-CM | POA: Diagnosis not present

## 2023-01-22 DIAGNOSIS — E785 Hyperlipidemia, unspecified: Secondary | ICD-10-CM | POA: Diagnosis not present

## 2023-01-22 DIAGNOSIS — M549 Dorsalgia, unspecified: Secondary | ICD-10-CM | POA: Diagnosis not present

## 2023-01-22 DIAGNOSIS — Z0000 Encounter for general adult medical examination without abnormal findings: Secondary | ICD-10-CM | POA: Diagnosis not present

## 2023-01-22 DIAGNOSIS — L4050 Arthropathic psoriasis, unspecified: Secondary | ICD-10-CM | POA: Diagnosis not present

## 2023-01-22 DIAGNOSIS — Z1211 Encounter for screening for malignant neoplasm of colon: Secondary | ICD-10-CM | POA: Diagnosis not present

## 2023-01-22 DIAGNOSIS — I10 Essential (primary) hypertension: Secondary | ICD-10-CM | POA: Diagnosis not present

## 2023-01-25 ENCOUNTER — Ambulatory Visit (HOSPITAL_COMMUNITY)
Admission: RE | Admit: 2023-01-25 | Discharge: 2023-01-25 | Disposition: A | Discharge status: Home / Self Care | Payer: PPO | Source: Ambulatory Visit | Attending: Family Medicine | Admitting: Family Medicine

## 2023-01-25 DIAGNOSIS — K8050 Calculus of bile duct without cholangitis or cholecystitis without obstruction: Secondary | ICD-10-CM | POA: Insufficient documentation

## 2023-01-25 DIAGNOSIS — K449 Diaphragmatic hernia without obstruction or gangrene: Secondary | ICD-10-CM | POA: Diagnosis not present

## 2023-01-25 DIAGNOSIS — D1803 Hemangioma of intra-abdominal structures: Secondary | ICD-10-CM | POA: Diagnosis not present

## 2023-01-25 DIAGNOSIS — K828 Other specified diseases of gallbladder: Secondary | ICD-10-CM | POA: Diagnosis not present

## 2023-01-25 DIAGNOSIS — I728 Aneurysm of other specified arteries: Secondary | ICD-10-CM | POA: Diagnosis not present

## 2023-01-25 MED ORDER — IOHEXOL 300 MG/ML  SOLN
100.0000 mL | Freq: Once | INTRAMUSCULAR | Status: AC | PRN
  Administered 2023-01-25: 100 mL via INTRAVENOUS

## 2023-01-29 ENCOUNTER — Other Ambulatory Visit (HOSPITAL_COMMUNITY): Payer: Self-pay | Admitting: Family Medicine

## 2023-01-29 DIAGNOSIS — K828 Other specified diseases of gallbladder: Secondary | ICD-10-CM

## 2023-02-05 ENCOUNTER — Ambulatory Visit (HOSPITAL_COMMUNITY)
Admission: RE | Admit: 2023-02-05 | Discharge: 2023-02-05 | Disposition: A | Discharge status: Home / Self Care | Payer: PPO | Source: Ambulatory Visit | Attending: Family Medicine | Admitting: Family Medicine

## 2023-02-05 ENCOUNTER — Other Ambulatory Visit (HOSPITAL_COMMUNITY): Payer: Self-pay | Admitting: Family Medicine

## 2023-02-05 DIAGNOSIS — K8020 Calculus of gallbladder without cholecystitis without obstruction: Secondary | ICD-10-CM | POA: Diagnosis not present

## 2023-02-05 DIAGNOSIS — K828 Other specified diseases of gallbladder: Secondary | ICD-10-CM | POA: Insufficient documentation

## 2023-02-05 MED ORDER — GADOBUTROL 1 MMOL/ML IV SOLN
8.0000 mL | Freq: Once | INTRAVENOUS | Status: AC | PRN
  Administered 2023-02-05: 8 mL via INTRAVENOUS

## 2023-02-09 ENCOUNTER — Encounter: Payer: Self-pay | Admitting: Student

## 2023-02-09 ENCOUNTER — Ambulatory Visit: Payer: PPO | Attending: Student | Admitting: Student

## 2023-02-09 VITALS — BP 106/62 | HR 54 | Ht 71.0 in | Wt 184.6 lb

## 2023-02-09 DIAGNOSIS — I493 Ventricular premature depolarization: Secondary | ICD-10-CM | POA: Diagnosis not present

## 2023-02-09 DIAGNOSIS — I4729 Other ventricular tachycardia: Secondary | ICD-10-CM | POA: Diagnosis not present

## 2023-02-09 NOTE — Patient Instructions (Signed)
Medication Instructions:  Your physician recommends that you continue on your current medications as directed. Please refer to the Current Medication list given to you today.  *If you need a refill on your cardiac medications before your next appointment, please call your pharmacy*  Lab Work: None ordered If you have labs (blood work) drawn today and your tests are completely normal, you will receive your results only by: MyChart Message (if you have MyChart) OR A paper copy in the mail If you have any lab test that is abnormal or we need to change your treatment, we will call you to review the results.  Follow-Up: At [facility], you and your health needs are our priority.  As part of our continuing mission to provide you with exceptional heart care, we have created designated Provider Care Teams.  These Care Teams include your primary Cardiologist (physician) and Advanced Practice Providers (APPs -  Physician Assistants and Nurse Practitioners) who all work together to provide you with the care you need, when you need it.  Your next appointment:   6 month(s)  Provider:   Cameron Lambert, MD  

## 2023-02-09 NOTE — Progress Notes (Signed)
  Electrophysiology Office Note:   Date:  02/09/2023  ID:  Chad Beasley, DOB 1950/05/04, MRN [id number]  Primary Cardiologist: Charlton Haws, MD Electrophysiologist: Lanier Prude, MD      History of Present Illness:   Chad Beasley is a 72 y.o. male with h/o PVCs and NSVT seen today for routine electrophysiology followup.   Since last being seen in our clinic the patient reports doing well overall from a cardiac perspective. He denies chest pain, palpitations, dyspnea, PND, orthopnea, nausea, vomiting, dizziness, syncope, edema, weight gain, or early satiety.   Review of systems complete and found to be negative unless listed in HPI.   EP Information / Studies Reviewed:    EKG is ordered today. Personal review as below.  EKG Interpretation Date/Time:  Friday February 09 2023 11:08:04 EST Ventricular Rate:  54 PR Interval:  186 QRS Duration:  80 QT Interval:  376 QTC Calculation: 356 R Axis:   20  Text Interpretation: Sinus bradycardia Confirmed by Maxine Glenn [phone]) on 02/09/2023 11:16:47 AM    Myoview 10/2020 Normal, low risk  Echo 10/2020 LVEF 50-55%, normal RV, mild MR  Physical Exam:   VS:  BP 106/62   Pulse (!) 54   Ht 5\' 11"  (1.803 m)   Wt 184 lb 9.6 oz (83.7 kg)   SpO2 97%   BMI 25.75 kg/m    Wt Readings from Last 3 Encounters:  02/09/23 184 lb 9.6 oz (83.7 kg)  12/19/22 181 lb 6.4 oz (82.3 kg)  08/11/22 179 lb 3.2 oz (81.3 kg)     GEN: Well nourished, well developed in no acute distress NECK: No JVD; No carotid bruits CARDIAC: Regular rate and rhythm, no murmurs, rubs, gallops RESPIRATORY:  Clear to auscultation without rales, wheezing or rhonchi  ABDOMEN: Soft, non-tender, non-distended EXTREMITIES:  No edema; No deformity   ASSESSMENT AND PLAN:    Frequent PVCs NSVT EKG today shows sinus brady Continue mexitil 150 mg TID Continue metoprolol 25 mg daily   Follow up with Dr. Lalla Brothers in 6 months  Signed, Graciella Freer, PA-C

## 2023-04-01 ENCOUNTER — Other Ambulatory Visit: Payer: Self-pay | Admitting: Cardiovascular Disease

## 2023-05-05 IMAGING — CT CT ANGIO CHEST
2 of 8 series · 16 of 46 positions shown · IV contrast (omnipaque)
Comparison: Coronary calcium score CT-02/04/2019

CLINICAL DATA: Evaluate ascending thoracic aortic aneurysm.

EXAM:
CT ANGIOGRAPHY CHEST WITH CONTRAST
TECHNIQUE: Multidetector CT imaging of the chest was performed using the
standard protocol during bolus administration of intravenous
contrast. Multiplanar CT image reconstructions and MIPs were
obtained to evaluate the vascular anatomy.
CONTRAST:  100mL OMNIPAQUE IOHEXOL 350 MG/ML SOLN

[Series 4: aorta 3.0 bf37 2 · axial · 0.78mm/px · z∈[-322,-30]mm · 13 of 115 slices shown]
[im 9/115  lung]
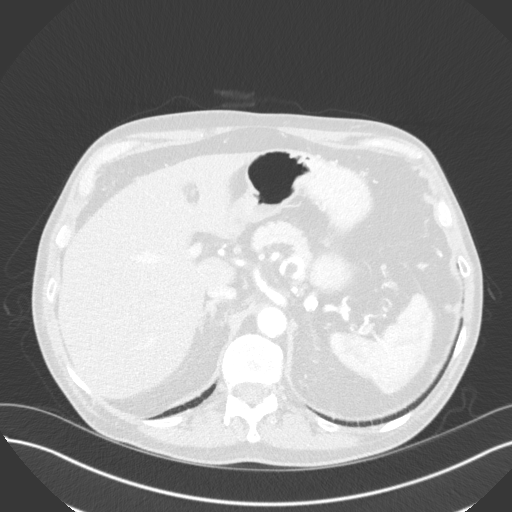
[im 17/115  soft-tissue]
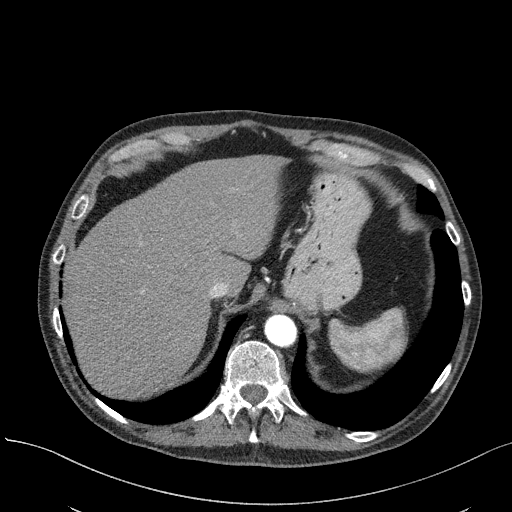
[im 25/115  lung]
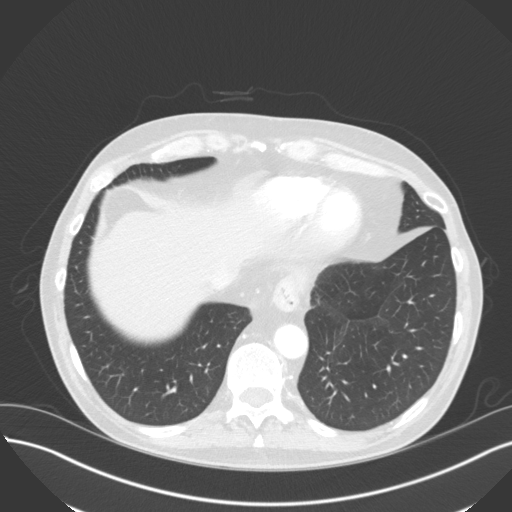
[im 33/115  soft-tissue]
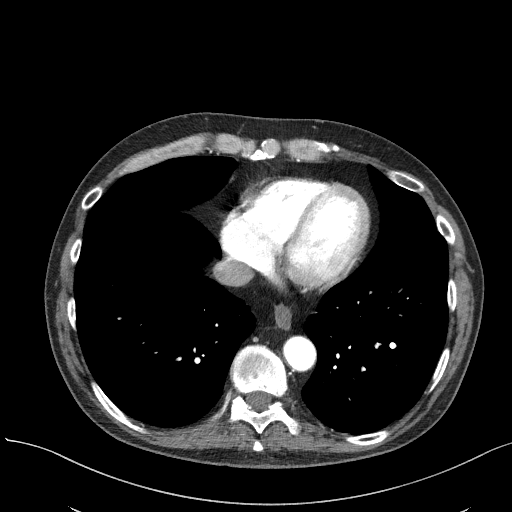
[im 41/115  lung]
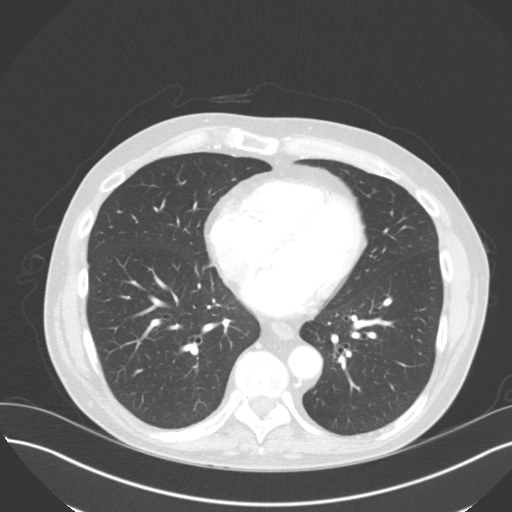
[im 49/115  soft-tissue]
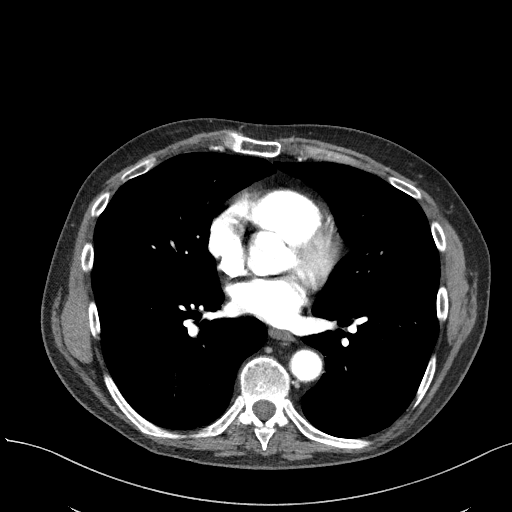
[im 58/115  lung]
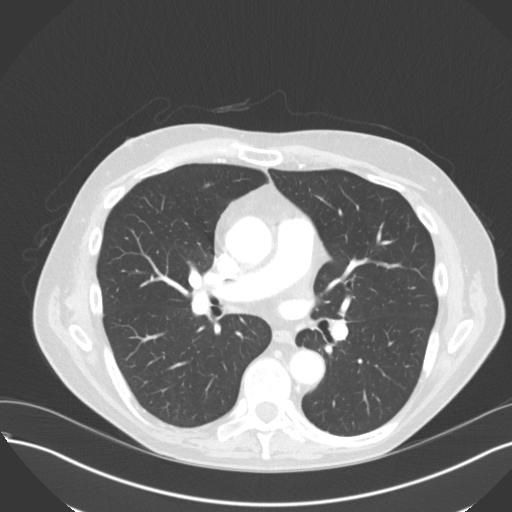
[im 66/115  soft-tissue]
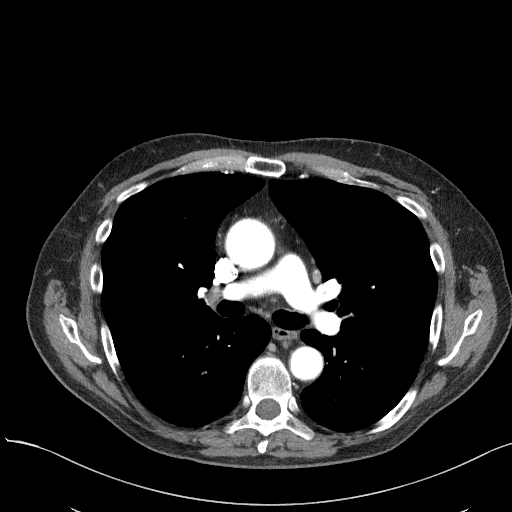
[im 74/115  lung]
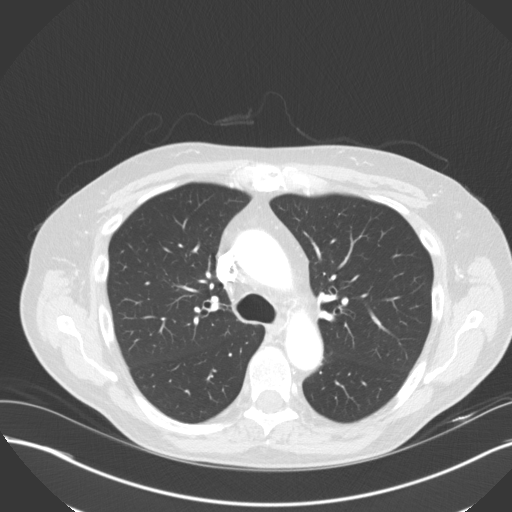
[im 82/115  soft-tissue]
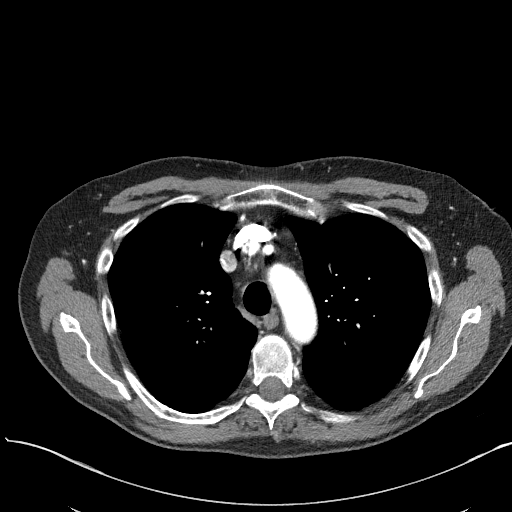
[im 90/115  lung]
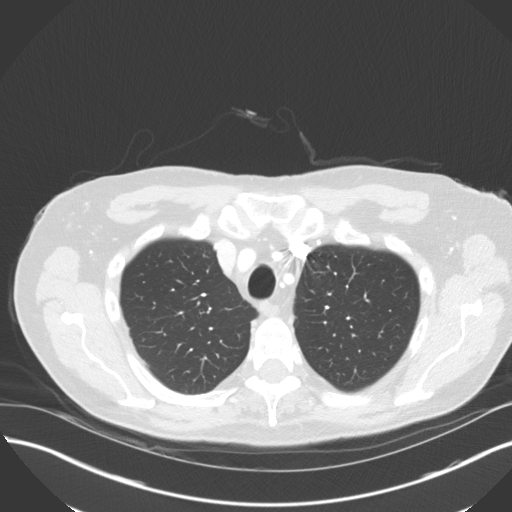
[im 98/115  soft-tissue]
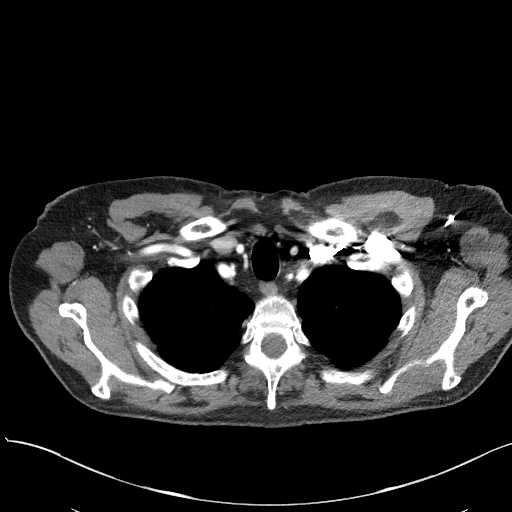
[im 106/115  lung]
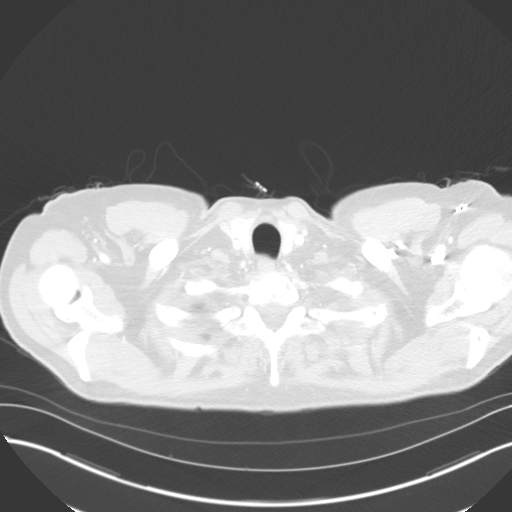

[Series 7: coronals · coronal · 0.70mm/px · 3 of 130 slices shown]
[im 33/130  soft-tissue]
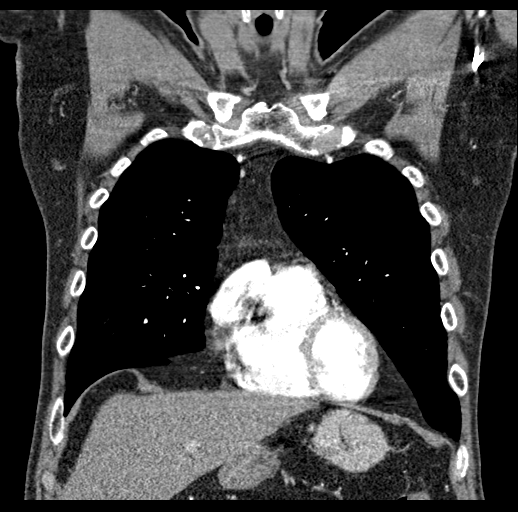
[im 65/130  soft-tissue]
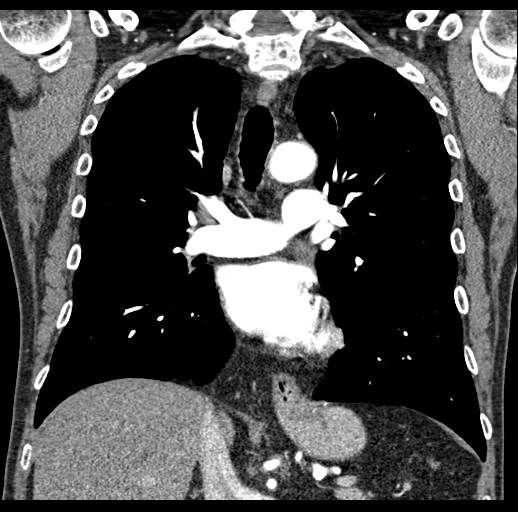
[im 97/130  soft-tissue]
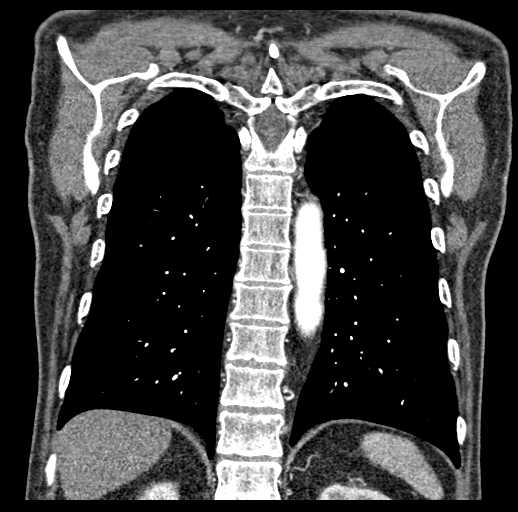

[16 of 46 positions shown; findings below may reference images not displayed]

FINDINGS: Vascular Findings:

Mild fusiform ectasia of the ascending thoracic aorta was
measurements as follows. The thoracic aorta tapers to a normal
caliber at the level of the aortic arch. There is a minimal amount
of atherosclerotic plaque involving the aortic arch, not resulting
in a hemodynamically significant stenosis. The descending thoracic
aorta is of normal caliber and widely patent without a
hemodynamically significant narrowing. No evidence of thoracic
aortic dissection or periaortic stranding on this nongated
examination.

The left vertebral artery is incidentally noted to arise directly
from the aortic arch. The branch vessels of the aortic arch appear
widely patent throughout their imaged courses.

Normal heart size. Coronary artery calcifications. No pericardial
effusion. Normal caliber of the main pulmonary artery.

-------------------------------------------------------------

Thoracic aortic measurements:

SINOTUBULAR JUNCTION: 30 mm as measured in greatest oblique short
axis coronal dimension.

PROXIMAL ASCENDING THORACIC AORTA: 39 mm as measured in greatest
oblique short axis axial dimension at the level of the main
pulmonary artery (axial image 52, series 4), and approximately 38 mm
in greatest oblique short axis coronal diameter (coronal image 45,
series 7), unchanged compared to the [DATE] examination.

AORTIC ARCH: 30 mm as measured in greatest oblique short axis
sagittal dimension.

PROXIMAL DESCENDING THORACIC AORTA: 27 mm as measured in greatest
oblique short axis axial dimension at the level of the main
pulmonary artery.

DISTAL DESCENDING THORACIC AORTA: 24 mm as measured in greatest
oblique short axis axial dimension at the level of the diaphragmatic
hiatus.

Review of the MIP images confirms the above findings.

-------------------------------------------------------------

Non-Vascular Findings:

Mediastinum/Lymph Nodes: Scattered mediastinal lymph nodes are not
enlarged by size criteria. No bulky mediastinal, hilar or axillary
lymphadenopathy.

Lungs/Pleura: Minimal biapical pleuroparenchymal thickening. No
discrete focal airspace opacities. No pleural effusion or
pneumothorax. The central pulmonary airways appear widely patent.

No discrete pulmonary nodules.

Upper abdomen: Limited early arterial phase evaluation of the upper
abdomen demonstrates a bilobed splenic artery aneurysm with each
component measuring approximately 0.9 cm in diameter (coronal images
65 and 68, series 7) of no clinical significance.

Musculoskeletal: No acute or aggressive osseous abnormalities.
Regional soft tissues appear normal. There are two hypoattenuating
nodules within the right lobe of the thyroid with dominant nodule
within the posterior aspect of the right lobe of the thyroid
measuring approximately 1.2 x 0.8 cm (image 8, series 4), both of
doubtful clinical concern.
IMPRESSION: 1. Stable uncomplicated mild fusiform ectasia of the ascending
thoracic aorta measuring 39 mm in diameter, unchanged compared to
the [DATE] examination.
2. Coronary artery calcifications. Aortic Atherosclerosis
([id number]).

## 2023-06-05 ENCOUNTER — Other Ambulatory Visit: Payer: Self-pay | Admitting: Student

## 2023-07-17 ENCOUNTER — Other Ambulatory Visit: Payer: Self-pay | Admitting: Cardiology

## 2023-08-10 DIAGNOSIS — H31002 Unspecified chorioretinal scars, left eye: Secondary | ICD-10-CM | POA: Diagnosis not present

## 2023-08-10 DIAGNOSIS — Z961 Presence of intraocular lens: Secondary | ICD-10-CM | POA: Diagnosis not present

## 2023-08-16 NOTE — Progress Notes (Signed)
  Electrophysiology Office Follow up Visit Note:    Date:  08/17/2023   ID:  Chung Chad Beasley, DOB [birth year], MRN [id number]  PCP:  Kip Righter, MD  [facility] Cardiologist:  Maude Emmer, MD  [facility] Electrophysiologist:  OLE ONEIDA HOLTS, MD    Interval History:     Chad Beasley is a 73 y.o. male who presents for a follow up visit.   I last saw the patient in August 11, 2022.  He has a history of PVCs.  He was previously followed by Dr. Kelsie.  He was on mexiletine and metoprolol  at the last appointment with me.  He saw Jodie in clinic February 09, 2023.  At that appointment he was doing well and his mexiletine/metoprolol  regimen was continued.      Past medical, surgical, social and family history were reviewed.  ROS:   Please see the history of present illness.    All other systems reviewed and are negative.  EKGs/Labs/Other Studies Reviewed:    The following studies were reviewed today:     EKG Interpretation Date/Time:  Friday August 17 2023 11:15:36 EDT Ventricular Rate:  74 PR Interval:  186 QRS Duration:  74 QT Interval:  342 QTC Calculation: 379 R Axis:   52  Text Interpretation: Normal sinus rhythm Confirmed by HOLTS OLE [id number]) on 08/17/2023 11:17:25 AM    Physical Exam:    VS:  BP 127/83   Pulse 74   Ht 5' 11 (1.803 m)   Wt 187 lb (84.8 kg)   SpO2 97%   BMI 26.08 kg/m     Wt Readings from Last 3 Encounters:  08/17/23 187 lb (84.8 kg)  02/09/23 184 lb 9.6 oz (83.7 kg)  12/19/22 181 lb 6.4 oz (82.3 kg)     GEN: no distress CARD: RRR, No MRG RESP: No IWOB. CTAB.      ASSESSMENT:    1. PVC's (premature ventricular contractions)   2. Encounter for long-term (current) use of high-risk medication    PLAN:    In order of problems listed above:  #Frequent PVCs Doing well.  Well-controlled Continue mexiletine and metoprolol   Follow-up 6 months with APP.   Signed, OLE HOLTS, MD, [facility], [facility] 08/17/2023 11:20 AM     Electrophysiology [facility]

## 2023-08-17 ENCOUNTER — Encounter: Payer: Self-pay | Admitting: Cardiology

## 2023-08-17 ENCOUNTER — Ambulatory Visit: Payer: PPO | Attending: Cardiology | Admitting: Cardiology

## 2023-08-17 VITALS — BP 127/83 | HR 74 | Ht 71.0 in | Wt 187.0 lb

## 2023-08-17 DIAGNOSIS — I493 Ventricular premature depolarization: Secondary | ICD-10-CM | POA: Diagnosis not present

## 2023-08-17 DIAGNOSIS — Z79899 Other long term (current) drug therapy: Secondary | ICD-10-CM | POA: Diagnosis not present

## 2023-08-17 NOTE — Patient Instructions (Signed)
 Medication Instructions:  Your physician recommends that you continue on your current medications as directed. Please refer to the Current Medication list given to you today.  *If you need a refill on your cardiac medications before your next appointment, please call your pharmacy*  Follow-Up: At [facility], you and your health needs are our priority.  As part of our continuing mission to provide you with exceptional heart care, our providers are all part of one team.  This team includes your primary Cardiologist (physician) and Advanced Practice Providers or APPs (Physician Assistants and Nurse Practitioners) who all work together to provide you with the care you need, when you need it.  Your next appointment:   6 months  Provider:   You may see Lanier Prude, MD or one of the following Advanced Practice Providers on your designated Care Team:   Francis Dowse, [place] [address] [place], [place] Sherie Don, NP Canary Brim, NP

## 2023-08-27 ENCOUNTER — Encounter: Payer: Self-pay | Admitting: Gastroenterology

## 2023-08-30 NOTE — Progress Notes (Signed)
 Ellouise Console, PA-C [address] [place], [place]  [address] Phone: [phone]   Gastroenterology Consultation  Referring Provider:     Kip Righter, MD Primary Care Physician:  Kip Righter, MD Primary Gastroenterologist:  Ellouise Console, PA-C / Elspeth Naval, MD  Reason for Consultation:     Hemorrhoids, Rectal Burning.        HPI:   Chad Beasley is a 73 y.o. y/o male referred for consultation & management  by Kip Righter, MD.    New patient.  Here to evaluate hemorrhoids and rectal symptoms.  Patient states he has had intermittent hemorrhoids since 2021.  He has used OTC Preparation H creams and suppositories which helped in the past.  He has had intermittent episodes of bright red rectal bleeding, but none recently.  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  Most recently he has been having rectal burning and itching at the rectum for 1 month.  The OTC Preparation H is not helping.  He has irregular bowel movements, mostly loose and formed stools.  He denies hard stools or straining.  He has had some left groin pain for 1 month.  No abdominal pain or unintentional weight loss.  He denies family history of colon or GI cancer.  No previous GI evaluation or colonoscopy.  He has never had a colonoscopy.  11/2022 last labs: CBC, CMP, TSH : Normal.  Hgb 14.2.  01/2023 abdominal MRI/MRCP: 1. Cholelithiasis. 2. No evidence of choledocholithiasis or biliary obstruction. No worrisome lesion in the common bile duct. 3. Normal pancreas.  01/2023 abdominal CT: Dilated gallbladder. There is some possible dependent stones versus soft tissue thickening. There is also some abnormal soft tissue density along the course of the cystic duct. For example series 5, image 41 of the coronal data set. With the appearance would recommend further workup such as MRI dynamic pre and postcontrast and MRCP to exclude an aggressive process when clinically appropriate.   Slight  fold thickening along the stomach with a small hiatal hernia.  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  She is currently legally blind.  Patient states he does not have any transportation for procedures.  Past Medical History:  Diagnosis Date   Ascending aorta dilation (HCC)    CHEST PAIN-UNSPECIFIED    HYPERTENSION    Premature atrial contractions    Premature ventricular contractions    PSORIASIS     Past Surgical History:  Procedure Laterality Date   APPENDECTOMY  80's   removal of melanoma  2016    Prior to Admission medications   Medication Sig Start Date End Date Taking? Authorizing Provider  Adalimumab 20 MG/0.4ML KIT Inject 40 mg into the skin See admin instructions. 2 times monthly    [provider]  aspirin  EC 81 MG tablet Take 1 tablet (81 mg total) by mouth daily. Swallow whole. 10/07/20   Dunn, Dayna N, PA-C  atorvastatin  (LIPITOR) 20 MG tablet TAKE 1 TABLET BY MOUTH EVERY DAY 04/02/23   Parthenia Olivia HERO, PA-C  clobetasol cream (TEMOVATE) 0.05 % Apply 1 application topically 2 (two) times daily.    [provider]  losartan  (COZAAR ) 50 MG tablet Take 1 tablet (50 mg total) by mouth daily. 12/19/22 08/17/23  Parthenia Olivia HERO, PA-C  metoprolol  succinate (TOPROL -XL) 25 MG 24 hr tablet TAKE 1 TABLET BY MOUTH EVERYDAY AT BEDTIME 06/06/23   Lesia Sharper  Prentice, PA-C  mexiletine (MEXITIL) 150 MG capsule TAKE 1 CAPSULE BY MOUTH TWICE A DAY 07/18/23   Cindie Ole DASEN, MD  Multiple Vitamins-Minerals (CENTRUM SILVER 50+MEN) TABS Take 1 tablet by mouth daily.    [provider]  Omeprazole (PRILOSEC PO) Take 1 tablet by mouth daily.    [provider]  vitamin C (ASCORBIC ACID) 500 MG tablet Take 1,000 mg by mouth daily.     [provider]    Family History  Problem Relation Age of Onset   Anemia Father    Cancer Sister        immune disease      Social History   Tobacco Use   Smoking status: Never   Smokeless tobacco: Never  Vaping Use   Vaping status: Never Used  Substance Use Topics   Alcohol use: Yes    Comment: rarely   Drug use: No    Allergies as of 08/31/2023   (No Known Allergies)    Review of Systems:    All systems reviewed and negative except where noted in HPI.   Physical Exam:  BP 122/78   Pulse 80   Ht 5' 11 (1.803 m)   Wt 182 lb (82.6 kg)   BMI 25.38 kg/m  No LMP for male patient.  General:   Alert,  Well-developed, well-nourished, pleasant and cooperative in NAD Lungs:  Respirations even and unlabored.  Clear throughout to auscultation.   No wheezes, crackles, or rhonchi. No acute distress. Heart:  Regular rate and rhythm; no murmurs, clicks, rubs, or gallops. Abdomen:  Normal bowel sounds.  No bruits.  Soft, and non-distended without masses, hepatosplenomegaly or hernias noted.  No Tenderness.  No guarding or rebound tenderness.    Rectal: There is erythematous rash around the anus, consistent with candidiasis.  No visible external hemorrhoids.  No fissures or abscesses.  Normal anal sphincter tone.  No internal rectal masses.  Hemoccult positive brown stool stool. Neurologic:  Alert and oriented x3;  grossly normal neurologically. Psych:  Alert and cooperative. Normal mood and affect.  Chaperone for Exam:  Alethea Blocker, CMA   Imaging Studies: No results found.  Labs: CBC    Component Value Date/Time   WBC 9.3 12/19/2022 1207   WBC 10.8 (H) 01/29/2012 1540   RBC 4.70 12/19/2022 1207   RBC 4.82 01/29/2012 1540   HGB 14.2 12/19/2022 1207   HCT 44.8 12/19/2022 1207   PLT 259 12/19/2022 1207   MCV 95 12/19/2022 1207   MCH 30.2 12/19/2022 1207   MCHC 31.7 12/19/2022 1207   MCHC 33.4 01/29/2012 1540   RDW 11.7 12/19/2022 1207   LYMPHSABS 2.7 01/29/2012 1540   MONOABS 0.9 01/29/2012 1540   EOSABS 0.3 01/29/2012 1540   BASOSABS 0.0 01/29/2012 1540    CMP     Component Value  Date/Time   NA 139 12/19/2022 1207   K 4.0 12/19/2022 1207   CL 105 12/19/2022 1207   CO2 22 12/19/2022 1207   GLUCOSE 112 (H) 12/19/2022 1207   GLUCOSE 78 02/05/2009 0000   BUN 13 12/19/2022 1207   CREATININE 1.08 12/19/2022 1207   CALCIUM  9.8 12/19/2022 1207   PROT 7.1 12/19/2022 1207   ALBUMIN 4.5 12/19/2022 1207   AST 31 12/19/2022 1207   ALT 23 12/19/2022 1207   ALKPHOS 89 12/19/2022 1207   BILITOT 0.5 12/19/2022 1207   GFRNONAA 91.86 02/05/2009 0000    Assessment and Plan:   Chad Beasley is a 73 y.o.  y/o male has been referred for:  Perianal Candidiasis - Rx: Nystatin  Cream TID until rash resolves, Disp: 30g, 1 RF - Keep the area clean and dry.  Rectal Bleeding - Heme + exam today; NO external hemorrhoids - I recommended scheduling a Colonoscopy and patient declined due to transportation issues. - He will call back to schedule a Colonoscopy once he figures out transportation.  Follow up As soon as he is able to schedule a Colonoscopy.  Ellouise Console, PA-C

## 2023-08-31 ENCOUNTER — Ambulatory Visit: Admitting: Physician Assistant

## 2023-08-31 ENCOUNTER — Encounter: Payer: Self-pay | Admitting: Physician Assistant

## 2023-08-31 VITALS — BP 122/78 | HR 80 | Ht 71.0 in | Wt 182.0 lb

## 2023-08-31 DIAGNOSIS — B3789 Other sites of candidiasis: Secondary | ICD-10-CM

## 2023-08-31 DIAGNOSIS — K625 Hemorrhage of anus and rectum: Secondary | ICD-10-CM

## 2023-08-31 DIAGNOSIS — R195 Other fecal abnormalities: Secondary | ICD-10-CM

## 2023-08-31 MED ORDER — NYSTATIN 100000 UNIT/GM EX CREA
1.0000 | TOPICAL_CREAM | Freq: Three times a day (TID) | CUTANEOUS | 1 refills | Status: DC
Start: 2023-08-31 — End: 2023-12-04

## 2023-08-31 NOTE — Patient Instructions (Addendum)
 _______________________________________________________  If your blood pressure at your visit was 140/90 or greater, please contact your primary care physician to follow up on this.  _______________________________________________________  If you are age 73 or older, your body mass index should be between 23-30. Your Body mass index is 25.38 kg/m. If this is out of the aforementioned range listed, please consider follow up with your Primary Care Provider.  If you are age 5 or younger, your body mass index should be between 19-25. Your Body mass index is 25.38 kg/m. If this is out of the aformentioned range listed, please consider follow up with your Primary Care Provider.   ________________________________________________________  The [facility] providers would like to encourage you to use MYCHART to communicate with providers for non-urgent requests or questions.  Due to long hold times on the telephone, sending your provider a message by [facility] may be a faster and more efficient way to get a response.  Please allow 48 business hours for a response.  Please remember that this is for non-urgent requests.  _______________________________________________________   It has been recommended to you by your physician that you have a(n) Colonoscopy completed. Per your request, we did not schedule the procedure(s) today. Please contact our office at [phone] or mychart message us  should you decide to have the procedure completed. You will be scheduled for a pre-visit and procedure at that time.   Medications has been sent to your pharmacy.  It was a pleasure to see you today!  Thank you for trusting me with your gastrointestinal care!

## 2023-08-31 NOTE — Progress Notes (Signed)
 Agree with his assessment and plan as outlined.  Should proceed with colonoscopy when he is ready to schedule and proceed.

## 2023-10-01 DIAGNOSIS — B372 Candidiasis of skin and nail: Secondary | ICD-10-CM | POA: Diagnosis not present

## 2023-10-02 ENCOUNTER — Other Ambulatory Visit: Payer: Self-pay | Admitting: Physician Assistant

## 2023-10-06 DIAGNOSIS — L400 Psoriasis vulgaris: Secondary | ICD-10-CM | POA: Diagnosis not present

## 2023-10-06 DIAGNOSIS — D225 Melanocytic nevi of trunk: Secondary | ICD-10-CM | POA: Diagnosis not present

## 2023-10-06 DIAGNOSIS — L299 Pruritus, unspecified: Secondary | ICD-10-CM | POA: Diagnosis not present

## 2023-11-05 ENCOUNTER — Telehealth: Payer: Self-pay | Admitting: Gastroenterology

## 2023-11-05 ENCOUNTER — Ambulatory Visit: Admitting: Gastroenterology

## 2023-11-05 NOTE — Telephone Encounter (Signed)
 Inbound call from patient requesting to schedule to be seen for abdominal issues. Patient requesting to schedule with provider.   No availability. Requesting nurse. Please advise.   Thank you

## 2023-11-05 NOTE — Telephone Encounter (Signed)
 Pt states he is still having problems with rectal pain and bleeding. Scheduled pt to see Dr Leigh 12/04/23@3 :40pm. Pt aware of appt and wanted to be placed on cancellation list.

## 2023-12-03 NOTE — Progress Notes (Signed)
 Cardiology Office Note:  .   Date:  12/17/2023  ID:  Chad Beasley, DOB 1950-05-01, MRN [id number] PCP: Kip Righter, MD  [facility] Providers Cardiologist:  Maude Emmer, MD Electrophysiologist:  OLE ONEIDA HOLTS, MD    History of Present Illness: .   Chad Beasley is a 73 y.o. 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  Patient here for routine f/u. Denies chest pain, palpitations, dyspnea, edema. Does a lot of yard work but no regular exercise. Patient scheduled for colonoscopy Wed Dr. Leigh.   ROS:    Studies Reviewed: SABRA         Prior CV Studies:   Myovue:  11/17/20   Study Highlights       The study is normal. The study is low risk.   No ST deviation was noted.   LV perfusion is normal. There is no evidence of ischemia. There is no evidence of infarction.   Left ventricular function is normal. Nuclear stress EF: 55 %. The left ventricular ejection fraction is normal (55-65%). End diastolic cavity size is normal.   Prior study available for comparison from 04/01/2004.   Reduced counts in the inferior segments with improvement on stress imaging and normal wall motion. This is consistent with diaphragm attenuation.  This is a normal study without evidence of ischemia or infarction.  Normal LVEF, 55%. This is a low risk study.      TTE 11/12/20   IMPRESSIONS     1. Global longitudinal strain is -14.3%      Inferolateral and distal anteroseptal/anterolateral hypokinesis. .  Left ventricular ejection fraction, by estimation, is 50 to 55%. The left  ventricle has low normal function. The left ventricle has no regional wall  motion abnormalities. Left  ventricular diastolic parameters were normal.   2. Right ventricular systolic function is normal. The right ventricular  size is normal.   3. The mitral valve is normal in structure. Mild mitral valve  regurgitation.   4. The aortic valve is tricuspid.  Aortic valve regurgitation is not  visualized. Mild to moderate aortic valve sclerosis/calcification is  present, without any evidence of aortic stenosis.   5. The inferior vena cava is normal in size with greater than 50%  respiratory variability, suggesting right atrial pressure of 3 mmHg.     Risk Assessment/Calculations:             Physical Exam:   VS:  BP 123/75 (BP Location: Left Arm, Patient Position: Sitting, Cuff Size: Normal)   Pulse 63   Ht 5' 11 (1.803 m)   Wt 183 lb 9.6 oz (83.3 kg)   SpO2 97%   BMI 25.61 kg/m    Orhtostatics: No data found. Wt Readings from Last 3 Encounters:  12/17/23 183 lb 9.6 oz (83.3 kg)  12/04/23 181 lb (82.1 kg)  08/31/23 182 lb (82.6 kg)    GEN: Well nourished, well developed in no acute distress NECK: No JVD; No carotid bruits CARDIAC:  RRR, 1/6 systolic murmur LSB RESPIRATORY:  Clear to auscultation without rales, wheezing or rhonchi  ABDOMEN: Soft, non-tender, non-distended EXTREMITIES:  No edema; No deformity   ASSESSMENT AND PLAN: .    Preop clearance for colonoscopy by Dr. Leigh [place] 12/19/23. We didn't receive a clearance request but patient is requesting. According to the Revised Cardiac Risk Index (RCRI), his Perioperative Risk of Major Cardiac Event is (%): 0.9  His Functional Capacity in METs is: 6.61  according to the [facility] Activity Status Index (DASI).   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 Cindie   HTN-BP controlled on losartan  and toprol  -surveillance labs today.   Ascending aorta dilation CTA 10/2020 3.9 cm -checking echo  Aortic valve sclerosis on echo 2022. Will repeat echo   CAD with elevated calcium  score 153, LAD/Cfx  2020, low risk myoview  10/2020-no angina.  HLD-on lipitor-needs FLP        Dispo: f/u Dr. Delford 1 yr  Signed, Olivia Pavy, PA-C

## 2023-12-04 ENCOUNTER — Other Ambulatory Visit: Payer: Self-pay | Admitting: Physician Assistant

## 2023-12-04 ENCOUNTER — Encounter: Payer: Self-pay | Admitting: Gastroenterology

## 2023-12-04 ENCOUNTER — Ambulatory Visit: Admitting: Gastroenterology

## 2023-12-04 VITALS — BP 124/70 | HR 70 | Ht 71.0 in | Wt 181.0 lb

## 2023-12-04 DIAGNOSIS — K641 Second degree hemorrhoids: Secondary | ICD-10-CM | POA: Diagnosis not present

## 2023-12-04 DIAGNOSIS — R195 Other fecal abnormalities: Secondary | ICD-10-CM | POA: Diagnosis not present

## 2023-12-04 DIAGNOSIS — K625 Hemorrhage of anus and rectum: Secondary | ICD-10-CM | POA: Diagnosis not present

## 2023-12-04 MED ORDER — CALMOL-4 76-10 % RE SUPP: RECTAL | 0 refills | Status: AC

## 2023-12-04 MED ORDER — FIBER 28.3 % PO POWD: ORAL | Status: AC

## 2023-12-04 MED ORDER — NA SULFATE-K SULFATE-MG SULF 17.5-3.13-1.6 GM/177ML PO SOLN: 1.0000 | Freq: Once | ORAL | 0 refills | Status: AC

## 2023-12-04 NOTE — Progress Notes (Signed)
 HPI :  73 year old male here for follow-up visit for rectal bleeding and hemorrhoids.  He was last seen in July of this year in the office.  Recall he has had intermittent rectal bleeding suspected to be related to hemorrhoids for a few years now.  Colonoscopy has been recommended in the past but he has not been able to have that done, transportation is an issue for him as his wife cannot drive.  He tells me he had a positive Cologuard test about a year ago.  He has had irritated hemorrhoids with itching, burning, and some grade 2 prolapse over years.  States things got worse over this past June.  He has been having intermittent blood noted on the toilet paper.  He has occasional straining with his stools but generally does not, he denies any abdominal pains.  Has been using Preparation H over-the-counter without relief.  He reminds us  that he had an anal fistula and perirectal abscess in 2021, never had a colonoscopy after that.  He improved on his own and has not had any recurrence after conservative measures to treat that.  He denies any cardiopulmonary symptoms.  He is interested in pursuing a colonoscopy however has concerned about getting transportation for it.  We discussed options.     01/2023 abdominal MRI/MRCP: 1. Cholelithiasis. 2. No evidence of choledocholithiasis or biliary obstruction. No worrisome lesion in the common bile duct. 3. Normal pancreas.   01/2023 abdominal CT: Dilated gallbladder. There is some possible dependent stones versus soft tissue thickening. There is also some abnormal soft tissue density along the course of the cystic duct. For example series 5, image 41 of the coronal data set. With the appearance would recommend further workup such as MRI dynamic pre and postcontrast and MRCP to exclude an aggressive process when clinically appropriate.   Slight fold thickening along the stomach with a small hiatal hernia.  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  She is currently legally blind.  Patient states he does not have any transportation for procedures.  Past Medical History:  Diagnosis Date   Ascending aorta dilation    CHEST PAIN-UNSPECIFIED    HYPERTENSION    Premature atrial contractions    Premature ventricular contractions    PSORIASIS      Past Surgical History:  Procedure Laterality Date   APPENDECTOMY  80's   removal of melanoma  2016   Family History  Problem Relation Age of Onset   Anemia Father    Cancer Sister        immune disease   Social History   Tobacco Use   Smoking status: Never   Smokeless tobacco: Never  Vaping Use   Vaping status: Never Used  Substance Use Topics   Alcohol use: Yes    Comment: rarely   Drug use: No   Current Outpatient Medications  Medication Sig Dispense Refill   adalimumab (HUMIRA, 2 SYRINGE,) 40 MG/0.8ML prefilled syringe 0.8 ml Subcutaneous every other week     Adalimumab 20 MG/0.4ML KIT Inject 40 mg into the skin See admin instructions. 2 times monthly     aspirin  EC 81 MG tablet Take 1 tablet (81 mg total) by mouth daily. Swallow whole. 30 tablet 11   atorvastatin  (LIPITOR) 20 MG tablet TAKE 1 TABLET BY MOUTH EVERY DAY 90 tablet 3   clobetasol cream (TEMOVATE) 0.05 % Apply 1 application topically 2 (two) times daily.  losartan  (COZAAR ) 50 MG tablet TAKE 1 TABLET BY MOUTH EVERY DAY 90 tablet 2   metoprolol  succinate (TOPROL -XL) 25 MG 24 hr tablet TAKE 1 TABLET BY MOUTH EVERYDAY AT BEDTIME 90 tablet 3   mexiletine (MEXITIL) 150 MG capsule TAKE 1 CAPSULE BY MOUTH TWICE A DAY 180 capsule 1   Multiple Vitamins-Minerals (CENTRUM SILVER 50+MEN) TABS Take 1 tablet by mouth daily.     Omeprazole (PRILOSEC PO) Take 1 tablet by mouth daily.     vitamin C (ASCORBIC ACID) 500 MG tablet Take 1,000 mg by mouth daily.      No current facility-administered medications for this visit.   No Known  Allergies   Review of Systems: All systems reviewed and negative except where noted in HPI.    No results found.  Physical Exam: BP 124/70   Pulse 70   Ht 5' 11 (1.803 m)   Wt 181 lb (82.1 kg)   BMI 25.24 kg/m  Constitutional: Pleasant,well-developed, male in no acute distress. DRE / Anoscopy - no fissure or perianal fistula, no mass lesions, hemorrhoids noted in all positions on anoscopy. Alwyn Sharps RN standby Neurological: Alert and oriented to person place and time. Psychiatric: Normal mood and affect. Behavior is normal.   ASSESSMENT: 73 y.o. male here for assessment of the following  1. Rectal bleeding   2. Grade II hemorrhoids   3. Positive colorectal cancer screening using Cologuard test    Ongoing intermittent rectal bleeding with symptoms likely attributed to hemorrhoids.  I do not appreciate anything else concerning on rectal exam and anoscopy.  However, he has never had a colonoscopy, had a positive Cologuard last year, colonoscopy is highly recommended to exclude neoplasia/polyps that could also cause bleeding.  We discussed this at length.  While I do think he may be a hemorrhoid candidate, I would not recommend doing that until he has a colonoscopy to exclude other sources of his bleeding.  We discussed risk benefits of colonoscopy and what it entails, he is willing to do it.  He is concerned about getting transportation and does not know how to coordinate that.  We gave him information for [facility], they can help coordinate a ride for him and he was appreciative of that.  Hopefully we can do colonoscopy in the near future.  If that does not show any concerning pathology for his symptoms then we will consider hemorrhoid banding.  In the interim, recommend he take a daily fiber supplement to keep stools soft to prevent any straining on the toilet.  Also provided some samples of Calmol 4 suppositories to use as needed.  He understands and agrees with the  plan  PLAN: - schedule colonoscopy at the [facility], needs transportation help, will contact [facility] to help coordinate a ride - daily fiber supplement - Citrucel or benefiber - Calmol4 suppositories - likely will proceed with hemorrhoid banding pending colonoscopy does not show any other etiology for his symptoms  Marcey Naval, MD [facility]

## 2023-12-04 NOTE — Patient Instructions (Addendum)
 You have been scheduled for a colonoscopy. Please follow written instructions given to you at your visit today.   If you use inhalers (even only as needed), please bring them with you on the day of your procedure.  DO NOT TAKE 7 DAYS PRIOR TO TEST- Trulicity (dulaglutide) Ozempic, Wegovy (semaglutide) Mounjaro (tirzepatide) Bydureon Bcise (exanatide extended release)  DO NOT TAKE 1 DAY PRIOR TO YOUR TEST Rybelsus (semaglutide) Adlyxin (lixisenatide) Victoza (liraglutide) Byetta (exanatide) ______________________________________________________________  Please purchase the following medications over the counter and take as directed: Daily fiber supplement  We have given you samples of the following medication to take: Calmol 4 suppositories - use as directed as needed  You can contact [facility] at [phone] to arrange for someone to drive you to your procedure, wait, and drive you home. The cost is approximately $100 (includes 4 hours), plus mileage. (Please note: Rates are subject to change)  Thank you for entrusting me with your care and for choosing [facility], Dr. Elspeth Naval   _______________________________________________________  If your blood pressure at your visit was 140/90 or greater, please contact your primary care physician to follow up on this.  _______________________________________________________  If you are age 60 or older, your body mass index should be between 23-30. Your Body mass index is 25.24 kg/m. If this is out of the aforementioned range listed, please consider follow up with your Primary Care Provider.  If you are age 29 or younger, your body mass index should be between 19-25. Your Body mass index is 25.24 kg/m. If this is out of the aformentioned range listed, please consider follow up with your Primary Care Provider.   ________________________________________________________  The [facility] providers  would like to encourage you to use MYCHART to communicate with providers for non-urgent requests or questions.  Due to long hold times on the telephone, sending your provider a message by [facility] may be a faster and more efficient way to get a response.  Please allow 48 business hours for a response.  Please remember that this is for non-urgent requests.  _______________________________________________________  [facility] is using a team-based approach to care.  Your team is made up of your doctor and two to three APPS. Our APPS (Nurse Practitioners and Physician Assistants) work with your physician to ensure care continuity for you. They are fully qualified to address your health concerns and develop a treatment plan. They communicate directly with your gastroenterologist to care for you. Seeing the Advanced Practice Practitioners on your physician's team can help you by facilitating care more promptly, often allowing for earlier appointments, access to diagnostic testing, procedures, and other specialty referrals.

## 2023-12-11 ENCOUNTER — Encounter: Payer: Self-pay | Admitting: Cardiovascular Disease

## 2023-12-12 ENCOUNTER — Encounter: Payer: Self-pay | Admitting: Gastroenterology

## 2023-12-17 ENCOUNTER — Ambulatory Visit: Attending: Physician Assistant | Admitting: Physician Assistant

## 2023-12-17 ENCOUNTER — Encounter: Payer: Self-pay | Admitting: Physician Assistant

## 2023-12-17 VITALS — BP 123/75 | HR 63 | Ht 71.0 in | Wt 183.6 lb

## 2023-12-17 DIAGNOSIS — I358 Other nonrheumatic aortic valve disorders: Secondary | ICD-10-CM | POA: Diagnosis not present

## 2023-12-17 DIAGNOSIS — I10 Essential (primary) hypertension: Secondary | ICD-10-CM | POA: Diagnosis not present

## 2023-12-17 DIAGNOSIS — I77810 Thoracic aortic ectasia: Secondary | ICD-10-CM

## 2023-12-17 DIAGNOSIS — I493 Ventricular premature depolarization: Secondary | ICD-10-CM

## 2023-12-17 DIAGNOSIS — I2510 Atherosclerotic heart disease of native coronary artery without angina pectoris: Secondary | ICD-10-CM | POA: Diagnosis not present

## 2023-12-17 DIAGNOSIS — Z01818 Encounter for other preprocedural examination: Secondary | ICD-10-CM

## 2023-12-17 LAB — LIPID PANEL

## 2023-12-17 NOTE — Patient Instructions (Addendum)
 Medication Instructions:  Your physician recommends that you continue on your current medications as directed. Please refer to the Current Medication list given to you today.  *If you need a refill on your cardiac medications before your next appointment, please call your pharmacy*  Lab Work: TODAY:  CMET, CBC, LIPID, & TSH  If you have labs (blood work) drawn today and your tests are completely normal, you will receive your results only by: MyChart Message (if you have MyChart) OR A paper copy in the mail If you have any lab test that is abnormal or we need to change your treatment, we will call you to review the results.  Testing/Procedures: Your physician has requested that you have an echocardiogram. Echocardiography is a painless test that uses sound waves to create images of your heart. It provides your doctor with information about the size and shape of your heart and how well your heart's chambers and valves are working. This procedure takes approximately one hour. There are no restrictions for this procedure. Please do NOT wear cologne, perfume, aftershave, or lotions (deodorant is allowed). Please arrive 15 minutes prior to your appointment time.  Please note: We ask at that you not bring children with you during ultrasound (echo/ vascular) testing. Due to room size and safety concerns, children are not allowed in the ultrasound rooms during exams. Our front office staff cannot provide observation of children in our lobby area while testing is being conducted. An adult accompanying a patient to their appointment will only be allowed in the ultrasound room at the discretion of the ultrasound technician under special circumstances. We apologize for any inconvenience.   Follow-Up: At [facility], you and your health needs are our priority.  As part of our continuing mission to provide you with exceptional heart care, our providers are all part of one team.  This team includes  your primary Cardiologist (physician) and Advanced Practice Providers or APPs (Physician Assistants and Nurse Practitioners) who all work together to provide you with the care you need, when you need it.  Your next appointment:   1 year(s)  Provider:   Maude Emmer, MD    We recommend signing up for the patient portal called MyChart.  Sign up information is provided on this After Visit Summary.  MyChart is used to connect with patients for Virtual Visits (Telemedicine).  Patients are able to view lab/test results, encounter notes, upcoming appointments, etc.  Non-urgent messages can be sent to your provider as well.   To learn more about what you can do with MyChart, go to [email or web address].   Other Instructions

## 2023-12-18 ENCOUNTER — Ambulatory Visit: Payer: Self-pay | Admitting: Physician Assistant

## 2023-12-18 LAB — LIPID PANEL
Cholesterol, Total: 196 mg/dL (ref 100–199)
HDL: 86 mg/dL (ref >39)
LDL CALC COMMENT:: 2.3 ratio (ref 0.0–5.0)
LDL Chol Calc (NIH): 92 mg/dL (ref 0–99)
Triglycerides: 105 mg/dL (ref 0–149)
VLDL Cholesterol Cal: 18 mg/dL (ref 5–40)

## 2023-12-18 LAB — COMPREHENSIVE METABOLIC PANEL WITH GFR
ALT: 16 IU/L (ref 0–44)
AST: 25 IU/L (ref 0–40)
Albumin: 4.5 g/dL (ref 3.8–4.8)
Alkaline Phosphatase: 89 IU/L (ref 47–123)
BUN/Creatinine Ratio: 13 (ref 10–24)
BUN: 11 mg/dL (ref 8–27)
Bilirubin Total: 0.8 mg/dL (ref 0.0–1.2)
CO2: 23 mmol/L (ref 20–29)
Calcium: 10.1 mg/dL (ref 8.6–10.2)
Chloride: 99 mmol/L (ref 96–106)
Creatinine, Ser: 0.82 mg/dL (ref 0.76–1.27)
Globulin, Total: 2.7 g/dL (ref 1.5–4.5)
Glucose: 79 mg/dL (ref 70–99)
Potassium: 5.1 mmol/L (ref 3.5–5.2)
Sodium: 137 mmol/L (ref 134–144)
Total Protein: 7.2 g/dL (ref 6.0–8.5)
eGFR: 93 mL/min/1.73 (ref >59)

## 2023-12-18 LAB — CBC
Hematocrit: 46.3 % (ref 37.5–51.0)
Hemoglobin: 15.2 g/dL (ref 13.0–17.7)
MCH: 31.2 pg (ref 26.6–33.0)
MCHC: 32.8 g/dL (ref 31.5–35.7)
MCV: 95 fL (ref 79–97)
Platelets: 293 x10E3/uL (ref 150–450)
RBC: 4.87 x10E6/uL (ref 4.14–5.80)
RDW: 12.2 % (ref 11.6–15.4)
WBC: 10.6 x10E3/uL (ref 3.4–10.8)

## 2023-12-18 LAB — TSH: TSH: 1.32 u[IU]/mL (ref 0.450–4.500)

## 2023-12-19 ENCOUNTER — Ambulatory Visit (AMBULATORY_SURGERY_CENTER): Admitting: Gastroenterology

## 2023-12-19 ENCOUNTER — Encounter: Payer: Self-pay | Admitting: Gastroenterology

## 2023-12-19 ENCOUNTER — Telehealth: Payer: Self-pay

## 2023-12-19 VITALS — BP 126/79 | HR 69 | Temp 97.8°F | Resp 19 | Ht 71.0 in | Wt 183.0 lb

## 2023-12-19 DIAGNOSIS — D120 Benign neoplasm of cecum: Secondary | ICD-10-CM | POA: Diagnosis not present

## 2023-12-19 DIAGNOSIS — D122 Benign neoplasm of ascending colon: Secondary | ICD-10-CM | POA: Diagnosis not present

## 2023-12-19 DIAGNOSIS — K648 Other hemorrhoids: Secondary | ICD-10-CM | POA: Diagnosis not present

## 2023-12-19 DIAGNOSIS — K635 Polyp of colon: Secondary | ICD-10-CM

## 2023-12-19 DIAGNOSIS — K649 Unspecified hemorrhoids: Secondary | ICD-10-CM

## 2023-12-19 DIAGNOSIS — K625 Hemorrhage of anus and rectum: Secondary | ICD-10-CM

## 2023-12-19 DIAGNOSIS — D125 Benign neoplasm of sigmoid colon: Secondary | ICD-10-CM | POA: Diagnosis not present

## 2023-12-19 DIAGNOSIS — Z1211 Encounter for screening for malignant neoplasm of colon: Secondary | ICD-10-CM

## 2023-12-19 DIAGNOSIS — R195 Other fecal abnormalities: Secondary | ICD-10-CM

## 2023-12-19 MED ORDER — SODIUM CHLORIDE 0.9 % IV SOLN: 500.0000 mL | INTRAVENOUS | Status: DC

## 2023-12-19 NOTE — Progress Notes (Signed)
 Report to PACU, RN, vss, BBS= Clear.

## 2023-12-19 NOTE — Op Note (Signed)
 [facility] Patient Name: Chad Beasley Procedure Date: 12/19/2023 1:23 PM MRN: [id number] Endoscopist: Elspeth P. Leigh , MD, [id number] Age: 73 Referring MD:  Date of Birth: 10/15/1950 Gender: Male Account #: [id number] Procedure:                Colonoscopy Indications:              Positive Cologuard test, rectal bleeding - thought                            to be due to hemorrhoids Medicines:                Monitored Anesthesia Care Procedure:                Pre-Anesthesia Assessment:                           - Prior to the procedure, a History and Physical                            was performed, and patient medications and                            allergies were reviewed. The patient's tolerance of                            previous anesthesia was also reviewed. The risks                            and benefits of the procedure and the sedation                            options and risks were discussed with the patient.                            All questions were answered, and informed consent                            was obtained. Prior Anticoagulants: The patient has                            taken no anticoagulant or antiplatelet agents. ASA                            Grade Assessment: II - A patient with mild systemic                            disease. After reviewing the risks and benefits,                            the patient was deemed in satisfactory condition to                            undergo the procedure.  After obtaining informed consent, the colonoscope                            was passed under direct vision. Throughout the                            procedure, the patient's blood pressure, pulse, and                            oxygen saturations were monitored continuously. The                            Olympus Scope SN: [id number] was introduced through                            the anus and advanced to the  the cecum, identified                            by appendiceal orifice and ileocecal valve. The                            colonoscopy was performed without difficulty. The                            patient tolerated the procedure well. The quality                            of the bowel preparation was good. The ileocecal                            valve, appendiceal orifice, and rectum were                            photographed. Scope In: 1:28:10 PM Scope Out: 1:44:58 PM Scope Withdrawal Time: 0 hours 14 minutes 43 seconds  Total Procedure Duration: 0 hours 16 minutes 48 seconds  Findings:                 Hemorrhoids were found on perianal exam.                           Two sessile polyps were found in the cecum. The                            polyps were 2 to 8 mm in size. These polyps were                            removed with a cold snare. Resection and retrieval                            were complete.                           A 4 mm polyp was found in the ascending colon. The  polyp was sessile. The polyp was removed with a                            cold snare. Resection and retrieval were complete.                           A 4 to 5 mm polyp was found in the sigmoid colon.                            The polyp was sessile. The polyp was removed with a                            cold snare. Resection and retrieval were complete.                           Internal hemorrhoids were found during retroflexion.                           The exam was otherwise without abnormality. Complications:            No immediate complications. Estimated blood loss:                            Minimal. Estimated Blood Loss:     Estimated blood loss was minimal. Impression:               - Hemorrhoids found on perianal exam.                           - Two 2 to 8 mm polyps in the cecum, removed with a                            cold snare. Resected and retrieved.                            - One 4 mm polyp in the ascending colon, removed                            with a cold snare. Resected and retrieved.                           - One 4 to 5 mm polyp in the sigmoid colon, removed                            with a cold snare. Resected and retrieved.                           - Internal hemorrhoids.                           - The examination was otherwise normal.                           Hemorrhoids are the cause  of bleeding symptoms. Recommendation:           - Patient has a contact number available for                            emergencies. The signs and symptoms of potential                            delayed complications were discussed with the                            patient. Return to normal activities tomorrow.                            Written discharge instructions were provided to the                            patient.                           - Resume previous diet.                           - Continue present medications.                           - Await pathology results.                           - Consideration for hemorrhoid banding to treat                            hemorrhoids, will discuss with the patient Elspeth SQUIBB. Arriyanna Mersch, MD 12/19/2023 1:51:19 PM This report has been signed electronically.

## 2023-12-19 NOTE — Telephone Encounter (Signed)
-----   Message from Chad Beasley sent at 12/19/2023  4:35 PM EDT ----- Regarding: hem banding Jan can you help schedule this patient for a hemorrhoid banding? I understand it will likely not be until January but could also put him on the wait list for office as well. Thanks

## 2023-12-19 NOTE — Progress Notes (Signed)
 History and Physical Interval Note: see note on 12/04/23 - history of intermittent rectal bleeding, positive cologuard. Colonoscopy to further evaluate. He has been seen / cleared by cardiology to proceed with this. I have discussed risks / benefits, he wishes to proceed. No interval changes since he was last seen.    12/19/2023 1:17 PM  Chad Beasley  has presented today for endoscopic procedure(s), with the diagnosis of  Encounter Diagnoses  Name Primary?   Positive colorectal cancer screening using Cologuard test Yes   Rectal bleeding   .  The various methods of evaluation and treatment have been discussed with the patient and/or family. After consideration of risks, benefits and other options for treatment, the patient has consented to  the endoscopic procedure(s).   The patient's history has been reviewed, patient examined, no change in status, stable for surgery.  I have reviewed the patient's chart and labs.  Questions were answered to the patient's satisfaction.    Marcey Naval, MD [facility]

## 2023-12-19 NOTE — Patient Instructions (Signed)
 Resume previous diet. Continue present medications. Awaiting pathology results. Handouts provided on polyps and hemorrhoids.   YOU HAD AN ENDOSCOPIC PROCEDURE TODAY AT THE [facility]:   Refer to the procedure report that was given to you for any specific questions about what was found during the examination.  If the procedure report does not answer your questions, please call your gastroenterologist to clarify.  If you requested that your care partner not be given the details of your procedure findings, then the procedure report has been included in a sealed envelope for you to review at your convenience later.  YOU SHOULD EXPECT: Some feelings of bloating in the abdomen. Passage of more gas than usual.  Walking can help get rid of the air that was put into your GI tract during the procedure and reduce the bloating. If you had a lower endoscopy (such as a colonoscopy or flexible sigmoidoscopy) you may notice spotting of blood in your stool or on the toilet paper. If you underwent a bowel prep for your procedure, you may not have a normal bowel movement for a few days.  Please Note:  You might notice some irritation and congestion in your nose or some drainage.  This is from the oxygen used during your procedure.  There is no need for concern and it should clear up in a day or so.  SYMPTOMS TO REPORT IMMEDIATELY:  Following lower endoscopy (colonoscopy or flexible sigmoidoscopy):  Excessive amounts of blood in the stool  Significant tenderness or worsening of abdominal pains  Swelling of the abdomen that is new, acute  Fever of 100F or higher  For urgent or emergent issues, a gastroenterologist can be reached at any hour by calling [phone]. Do not use MyChart messaging for urgent concerns.    DIET:  We do recommend a small meal at first, but then you may proceed to your regular diet.  Drink plenty of fluids but you should avoid alcoholic beverages for 24  hours.  ACTIVITY:  You should plan to take it easy for the rest of today and you should NOT DRIVE or use heavy machinery until tomorrow (because of the sedation medicines used during the test).    FOLLOW UP: Our staff will call the number listed on your records the next business day following your procedure.  We will call around 7:15- 8:00 am to check on you and address any questions or concerns that you may have regarding the information given to you following your procedure. If we do not reach you, we will leave a message.     If any biopsies were taken you will be contacted by phone or by letter within the next 1-3 weeks.  Please call us  at [phone] if you have not heard about the biopsies in 3 weeks.    SIGNATURES/CONFIDENTIALITY: You and/or your care partner have signed paperwork which will be entered into your electronic medical record.  These signatures attest to the fact that that the information above on your After Visit Summary has been reviewed and is understood.  Full responsibility of the confidentiality of this discharge information lies with you and/or your care-partner.

## 2023-12-19 NOTE — Progress Notes (Signed)
 Called to room to assist during endoscopic procedure.  Patient ID and intended procedure confirmed with present staff. Received instructions for my participation in the procedure from the performing physician.

## 2023-12-19 NOTE — Telephone Encounter (Signed)
 Patient added to OV wait list for 1st hemorrhoid banding

## 2023-12-20 ENCOUNTER — Telehealth: Payer: Self-pay

## 2023-12-20 NOTE — Telephone Encounter (Signed)
  Follow up Call-     12/19/2023    1:15 PM  Call back number  Post procedure Call Back phone  # [phone]  Permission to leave phone message Yes     Patient questions:  Do you have a fever, pain , or abdominal swelling? No. Pain Score  0 *  Have you tolerated food without any problems? Yes.    Have you been able to return to your normal activities? Yes.    Do you have any questions about your discharge instructions: Diet   No. Medications  No. Follow up visit  No.  Do you have questions or concerns about your Care? No.  Actions: * If pain score is 4 or above: No action needed, pain <4.

## 2023-12-21 MED ORDER — ROSUVASTATIN CALCIUM 20 MG PO TABS: 20.0000 mg | ORAL_TABLET | Freq: Every day | ORAL | 3 refills | Status: AC

## 2023-12-24 LAB — SURGICAL PATHOLOGY

## 2023-12-25 ENCOUNTER — Telehealth: Payer: Self-pay | Admitting: Physician Assistant

## 2023-12-25 DIAGNOSIS — E785 Hyperlipidemia, unspecified: Secondary | ICD-10-CM

## 2023-12-25 DIAGNOSIS — E7800 Pure hypercholesterolemia, unspecified: Secondary | ICD-10-CM

## 2023-12-25 NOTE — Telephone Encounter (Signed)
 Pt c/o medication issue:  1. Name of Medication:   rosuvastatin (CRESTOR) 20 MG tablet    2. How are you currently taking this medication (dosage and times per day)? As written  3. Are you having a reaction (difficulty breathing--STAT)? No  4. What is your medication issue? Pt states that he has questions regarding medication. Please advise

## 2023-12-25 NOTE — Telephone Encounter (Signed)
 Spoke with pt who stated that he would be starting the Rosuvastatin tomorrow. Pt asked about repeat labs that need to be completed in 3 months. Explained pt that he will not need an appt and can go to any [facility] for these. Order for repeat FLP has been placed and released. Pt also stated that he has been having higher BP readings 130s/140s SBP over the last week and noticed it has been since he started his newest bottle of Losartan . Pt asked if there was any correlation to starting the new bottle. Explained that pt that it was not likely but we do not have any way of knowing this. Explained we will send this information to Olivia Pavy to review. Pt verbalized understanding of plan and had no further questions or concerns at this time.

## 2023-12-27 NOTE — Telephone Encounter (Signed)
 Patient returning call.

## 2023-12-27 NOTE — Telephone Encounter (Signed)
 Left message for patient to call back.  We currently have availability to complete hemorrhoidal banding on 02/25/24.

## 2023-12-27 NOTE — Telephone Encounter (Signed)
 Patient has scheduled for hemorrhoidal banding on 02/27/24 at 11 am with Dr Leigh.

## 2023-12-28 ENCOUNTER — Ambulatory Visit: Payer: Self-pay | Admitting: Gastroenterology

## 2024-01-08 NOTE — Telephone Encounter (Signed)
 Spoke with patient. He is ok with the dose change for cholesterol medication. The medication is not giving him any problems at this time.

## 2024-01-14 ENCOUNTER — Telehealth: Payer: Self-pay | Admitting: Cardiology

## 2024-01-14 MED ORDER — MEXILETINE HCL 150 MG PO CAPS: 150.0000 mg | ORAL_CAPSULE | Freq: Two times a day (BID) | ORAL | 1 refills | Status: AC

## 2024-01-14 NOTE — Telephone Encounter (Signed)
 Refill sent.

## 2024-01-14 NOTE — Telephone Encounter (Signed)
*  STAT* If patient is at the pharmacy, call can be transferred to refill team.   1. Which medications need to be refilled? (please list name of each medication and dose if known) mexiletine (MEXITIL) 150 MG capsule     4. Which pharmacy/location (including street and city if local pharmacy) is medication to be sent to?  [id number] - [place], [place] - [address] AT [address] OF [address]     5. Do they need a 30 day or 90 day supply? 90

## 2024-01-23 ENCOUNTER — Ambulatory Visit (HOSPITAL_COMMUNITY)
Admission: RE | Admit: 2024-01-23 | Discharge: 2024-01-23 | Disposition: A | Source: Ambulatory Visit | Attending: Physician Assistant | Admitting: Physician Assistant

## 2024-01-23 DIAGNOSIS — I77810 Thoracic aortic ectasia: Secondary | ICD-10-CM | POA: Diagnosis present

## 2024-01-23 DIAGNOSIS — I358 Other nonrheumatic aortic valve disorders: Secondary | ICD-10-CM | POA: Insufficient documentation

## 2024-01-23 DIAGNOSIS — I10 Essential (primary) hypertension: Secondary | ICD-10-CM | POA: Diagnosis not present

## 2024-01-23 LAB — ECHOCARDIOGRAM COMPLETE
AR max vel: 2.37 cm2
AV Area VTI: 2.33 cm2
AV Area mean vel: 2.38 cm2
AV Mean grad: 3 mmHg
AV Peak grad: 6 mmHg
Ao pk vel: 1.22 m/s
Area-P 1/2: 3.12 cm2
MV M vel: 2.56 m/s
MV Peak grad: 26.2 mmHg
S' Lateral: 2.67 cm

## 2024-02-18 ENCOUNTER — Encounter: Payer: Self-pay | Admitting: Student

## 2024-02-18 ENCOUNTER — Ambulatory Visit: Attending: Student | Admitting: Student

## 2024-02-18 VITALS — BP 128/64 | HR 51 | Ht 71.0 in | Wt 184.0 lb

## 2024-02-18 DIAGNOSIS — I10 Essential (primary) hypertension: Secondary | ICD-10-CM

## 2024-02-18 DIAGNOSIS — I493 Ventricular premature depolarization: Secondary | ICD-10-CM | POA: Diagnosis not present

## 2024-02-18 DIAGNOSIS — I4729 Other ventricular tachycardia: Secondary | ICD-10-CM | POA: Diagnosis not present

## 2024-02-18 NOTE — Progress Notes (Signed)
" °  Electrophysiology Office Note:   Date:  02/18/2024  ID:  Chad Beasley, DOB 06-26-1950, MRN [id number]  Primary Cardiologist: Maude Emmer, MD Electrophysiologist: Will Gladis Norton, MD   Electrophysiologist:  Soyla Gladis Norton, MD      History of Present Illness:   Chad Beasley is a 73 y.o. male with h/o PVCs seen today for routine electrophysiology followup.   Since last being seen in our clinic the patient reports doing very well. Overall, he denies chest pain, palpitations, dyspnea, PND, orthopnea, nausea, vomiting, dizziness, syncope, edema, weight gain, or early satiety.   Review of systems complete and found to be negative unless listed in HPI.   EP Information / Studies Reviewed:    EKG is ordered today. Personal review as below.  EKG Interpretation Date/Time:  Monday February 18 2024 11:27:07 EST Ventricular Rate:  51 PR Interval:  192 QRS Duration:  80 QT Interval:  376 QTC Calculation: 346 R Axis:   17  Text Interpretation: Sinus bradycardia Confirmed by Lesia Sharper [phone]) on 02/18/2024 11:28:15 AM    Arrhythmia/Device History No specialty comments available.   Physical Exam:   VS:  BP 128/64   Pulse (!) 51   Ht 5' 11 (1.803 m)   Wt 184 lb (83.5 kg)   SpO2 97%   BMI 25.66 kg/m    Wt Readings from Last 3 Encounters:  02/18/24 184 lb (83.5 kg)  12/19/23 183 lb (83 kg)  12/17/23 183 lb 9.6 oz (83.3 kg)     GEN: No acute distress NECK: No JVD; No carotid bruits CARDIAC: Regular rate and rhythm, no murmurs, rubs, gallops RESPIRATORY:  Clear to auscultation without rales, wheezing or rhonchi  ABDOMEN: Soft, non-tender, non-distended EXTREMITIES:  No edema; No deformity   ASSESSMENT AND PLAN:    PVCs NSVT High risk medication monitoring - Mexitil EKG today shows sinus bradycardia with stable intervals Stable labs 11/2023 Continue mexitil 150 mg TID Continue toprol  25 mg daily  HTN Stable on current regimen      Follow up with Dr.  Norton in 6 months - Then can likely stretch out to 1 year with alternating 6 month visits with gen cards.  Signed, Sharper Prentice Lesia, PA-C  "

## 2024-02-18 NOTE — Patient Instructions (Addendum)
 Medication Instructions:  No medication changes today. *If you need a refill on your cardiac medications before your next appointment, please call your pharmacy*  Lab Work: No labwork ordered today. If you have labs (blood work) drawn today and your tests are completely normal, you will receive your results only by: MyChart Message (if you have MyChart) OR A paper copy in the mail If you have any lab test that is abnormal or we need to change your treatment, we will call you to review the results.  Testing/Procedures: No testing ordered today  Follow-Up: At [facility], you and your health needs are our priority.  As part of our continuing mission to provide you with exceptional heart care, our providers are all part of one team.  This team includes your primary Cardiologist (physician) and Advanced Practice Providers or APPs (Physician Assistants and Nurse Practitioners) who all work together to provide you with the care you need, when you need it.  Your next appointment:   6 month(s)  Provider:   Will Gladis Norton, MD   We recommend signing up for the patient portal called MyChart.  Sign up information is provided on this After Visit Summary.  MyChart is used to connect with patients for Virtual Visits (Telemedicine).  Patients are able to view lab/test results, encounter notes, upcoming appointments, etc.  Non-urgent messages can be sent to your provider as well.   To learn more about what you can do with MyChart, go to [email or web address].

## 2024-02-27 ENCOUNTER — Encounter: Payer: Self-pay | Admitting: Gastroenterology

## 2024-02-27 ENCOUNTER — Ambulatory Visit: Admitting: Gastroenterology

## 2024-02-27 VITALS — BP 116/70 | HR 67 | Ht 71.0 in | Wt 183.0 lb

## 2024-02-27 DIAGNOSIS — K641 Second degree hemorrhoids: Secondary | ICD-10-CM

## 2024-02-27 NOTE — Patient Instructions (Signed)
 HEMORRHOID BANDING PROCEDURE    FOLLOW-UP CARE   The procedure you have had should have been relatively painless since the banding of the area involved does not have nerve endings and there is no pain sensation.  The rubber band cuts off the blood supply to the hemorrhoid and the band may fall off as soon as 48 hours after the banding (the band may occasionally be seen in the toilet bowl following a bowel movement). You may notice a temporary feeling of fullness in the rectum which should respond adequately to plain Tylenol or Motrin.  Following the banding, avoid strenuous exercise that evening and resume full activity the next day.  A sitz bath (soaking in a warm tub) or bidet is soothing, and can be useful for cleansing the area after bowel movements.     To avoid constipation, take two tablespoons of natural wheat bran, natural oat bran, flax, Benefiber or any over the counter fiber supplement and increase your water intake to 7-8 glasses daily.    Unless you have been prescribed anorectal medication, do not put anything inside your rectum for two weeks: No suppositories, enemas, fingers, etc.  Occasionally, you may have more bleeding than usual after the banding procedure.  This is often from the untreated hemorrhoids rather than the treated one.  Dont be concerned if there is a tablespoon or so of blood.  If there is more blood than this, lie flat with your bottom higher than your head and apply an ice pack to the area. If the bleeding does not stop within a half an hour or if you feel faint, call our office at [phone] or go to the emergency room.  Problems are not common; however, if there is a substantial amount of bleeding, severe pain, chills, fever or difficulty passing urine (very rare) or other problems, you should call us  at [phone] or report to the nearest emergency room.  Do not stay seated continuously for more than 2-3 hours for a day or two after the procedure.   Tighten your buttock muscles 10-15 times every two hours and take 10-15 deep breaths every 1-2 hours.  Do not spend more than a few minutes on the toilet if you cannot empty your bowel; instead re-visit the toilet at a later time.    You have been scheduled for a 2nd banding appointment on Thursday, 04-24-24 at 3:40pm. Please arrive 10 minutes early for registration. If you need to reschedule or cancel this appointment please call [phone] as soon as possible. Thank you.  Thank you for entrusting me with your care and for choosing [facility], Dr. Elspeth Naval    _______________________________________________________  If your blood pressure at your visit was 140/90 or greater, please contact your primary care physician to follow up on this.  _______________________________________________________  If you are age 74 or older, your body mass index should be between 23-30. Your Body mass index is 25.52 kg/m. If this is out of the aforementioned range listed, please consider follow up with your Primary Care Provider.  If you are age 81 or younger, your body mass index should be between 19-25. Your Body mass index is 25.52 kg/m. If this is out of the aformentioned range listed, please consider follow up with your Primary Care Provider.   ________________________________________________________  The [facility] providers would like to encourage you to use MYCHART to communicate with providers for non-urgent requests or questions.  Due to long hold times on the telephone,  sending your provider a message by [facility] may be a faster and more efficient way to get a response.  Please allow 48 business hours for a response.  Please remember that this is for non-urgent requests.  _______________________________________________________  [facility] is using a team-based approach to care.  Your team is made up of your doctor and two to three APPS. Our APPS (Nurse Practitioners  and Physician Assistants) work with your physician to ensure care continuity for you. They are fully qualified to address your health concerns and develop a treatment plan. They communicate directly with your gastroenterologist to care for you. Seeing the Advanced Practice Practitioners on your physician's team can help you by facilitating care more promptly, often allowing for earlier appointments, access to diagnostic testing, procedures, and other specialty referrals.

## 2024-02-27 NOTE — Progress Notes (Signed)
 74 year old male here for a follow-up visit for hemorrhoid banding today.  Recall he had a colonoscopy in October for rectal bleeding and a positive Cologuard test.  He had a few small adenomas removed and hemorrhoids were thought to be the cause of his rectal bleeding.  He has itching and grade 2 prolapse along with periodic bleeding.  He has been on fiber supplementation to keep stools soft and preventing straining.  We discussed options for hemorrhoid therapy to include surgery versus banding, following discussion of risks and benefits he wanted to proceed with banding today.  Of note he has a persistent perianal rash that been ongoing for months.  He has had multiple courses of antifungals as well as topical steroid creams and this continues to be bothersome for him.  He does see a dermatologist and has psoriasis, they did not think this was consistent with psoriasis but rash has persisted, I encouraged him to follow-up with his dermatologist for this    PROCEDURE NOTE: The patient presents with symptomatic grade II  hemorrhoids, requesting rubber band ligation of his/her hemorrhoidal disease.  All risks, benefits and alternative forms of therapy were described and informed consent was obtained.  The anorectum was pre-medicated with recticare The decision was made to band the LL internal hemorrhoid, and the [facility] ORegan System was used to perform band ligation without complication.  Digital anorectal examination was then performed to assure proper positioning of the band, and to adjust the banded tissue as required.  The patient was discharged home without pain or other issues.  Dietary and behavioral recommendations were given and along with follow-up instructions.     The following adjunctive treatments were recommended: Daily fiber supplement See dermatology for perianal rash  The patient will return in 4 weeks for  follow-up and possible additional banding as required. No complications  were encountered and the patient tolerated the procedure well.  Marcey Naval, MD [facility]

## 2024-04-24 ENCOUNTER — Encounter: Admitting: Gastroenterology
